# Patient Record
Sex: Female | Born: 1970
Health system: Southern US, Community
[De-identification: ages and names within clinical notes are randomized; demographics above are authoritative.]

## PROBLEM LIST (undated history)

## (undated) DIAGNOSIS — K5909 Other constipation: Secondary | ICD-10-CM

## (undated) DIAGNOSIS — M1990 Unspecified osteoarthritis, unspecified site: Secondary | ICD-10-CM

## (undated) DIAGNOSIS — N920 Excessive and frequent menstruation with regular cycle: Secondary | ICD-10-CM

## (undated) DIAGNOSIS — K589 Irritable bowel syndrome without diarrhea: Secondary | ICD-10-CM

## (undated) HISTORY — PX: HYSTEROSCOPY WITH D & C: SHX1775

## (undated) HISTORY — DX: Irritable bowel syndrome, unspecified: K58.9

---

## 1997-07-19 ENCOUNTER — Other Ambulatory Visit: Admission: RE | Admit: 1997-07-19 | Discharge: 1997-07-19 | Payer: Self-pay | Admitting: Obstetrics and Gynecology

## 1997-09-10 ENCOUNTER — Inpatient Hospital Stay (HOSPITAL_COMMUNITY): Admission: AD | Admit: 1997-09-10 | Discharge: 1997-09-10 | Payer: Self-pay | Admitting: Obstetrics & Gynecology

## 1997-12-26 ENCOUNTER — Encounter: Admission: RE | Admit: 1997-12-26 | Discharge: 1998-03-26 | Payer: Self-pay | Admitting: Obstetrics and Gynecology

## 1998-03-06 ENCOUNTER — Inpatient Hospital Stay (HOSPITAL_COMMUNITY): Admission: AD | Admit: 1998-03-06 | Discharge: 1998-03-09 | Payer: Self-pay | Admitting: Obstetrics and Gynecology

## 1998-09-13 ENCOUNTER — Other Ambulatory Visit: Admission: RE | Admit: 1998-09-13 | Discharge: 1998-09-13 | Payer: Self-pay | Admitting: Obstetrics and Gynecology

## 2000-08-01 ENCOUNTER — Emergency Department (HOSPITAL_COMMUNITY): Admission: EM | Admit: 2000-08-01 | Discharge: 2000-08-01 | Payer: Self-pay | Admitting: Emergency Medicine

## 2000-09-16 ENCOUNTER — Other Ambulatory Visit: Admission: RE | Admit: 2000-09-16 | Discharge: 2000-09-16 | Payer: Self-pay | Admitting: Obstetrics and Gynecology

## 2000-10-12 ENCOUNTER — Ambulatory Visit (HOSPITAL_COMMUNITY): Admission: RE | Admit: 2000-10-12 | Discharge: 2000-10-12 | Payer: Self-pay | Admitting: Obstetrics and Gynecology

## 2000-10-12 HISTORY — PX: DIAGNOSTIC LAPAROSCOPY: SUR761

## 2001-12-22 ENCOUNTER — Other Ambulatory Visit: Admission: RE | Admit: 2001-12-22 | Discharge: 2001-12-22 | Payer: Self-pay | Admitting: Obstetrics and Gynecology

## 2002-03-11 ENCOUNTER — Emergency Department (HOSPITAL_COMMUNITY): Admission: EM | Admit: 2002-03-11 | Discharge: 2002-03-11 | Payer: Self-pay | Admitting: Emergency Medicine

## 2002-03-11 ENCOUNTER — Encounter: Payer: Self-pay | Admitting: Emergency Medicine

## 2003-08-02 ENCOUNTER — Other Ambulatory Visit: Admission: RE | Admit: 2003-08-02 | Discharge: 2003-08-02 | Payer: Self-pay | Admitting: Obstetrics and Gynecology

## 2003-10-27 ENCOUNTER — Encounter: Admission: RE | Admit: 2003-10-27 | Discharge: 2003-10-27 | Payer: Self-pay | Admitting: Internal Medicine

## 2004-06-28 ENCOUNTER — Ambulatory Visit (HOSPITAL_COMMUNITY): Admission: RE | Admit: 2004-06-28 | Discharge: 2004-06-28 | Payer: Self-pay | Admitting: Obstetrics and Gynecology

## 2004-09-26 ENCOUNTER — Other Ambulatory Visit: Admission: RE | Admit: 2004-09-26 | Discharge: 2004-09-26 | Payer: Self-pay | Admitting: Obstetrics and Gynecology

## 2004-10-13 ENCOUNTER — Inpatient Hospital Stay (HOSPITAL_COMMUNITY): Admission: AD | Admit: 2004-10-13 | Discharge: 2004-10-13 | Payer: Self-pay | Admitting: Obstetrics and Gynecology

## 2005-03-06 ENCOUNTER — Ambulatory Visit (HOSPITAL_COMMUNITY): Admission: RE | Admit: 2005-03-06 | Discharge: 2005-03-06 | Payer: Self-pay | Admitting: Obstetrics and Gynecology

## 2005-04-03 ENCOUNTER — Inpatient Hospital Stay (HOSPITAL_COMMUNITY): Admission: AD | Admit: 2005-04-03 | Discharge: 2005-04-05 | Payer: Self-pay | Admitting: Obstetrics and Gynecology

## 2005-05-07 ENCOUNTER — Other Ambulatory Visit: Admission: RE | Admit: 2005-05-07 | Discharge: 2005-05-07 | Payer: Self-pay | Admitting: Obstetrics and Gynecology

## 2005-10-10 ENCOUNTER — Encounter: Admission: RE | Admit: 2005-10-10 | Discharge: 2005-10-10 | Payer: Self-pay | Admitting: Internal Medicine

## 2005-10-17 ENCOUNTER — Other Ambulatory Visit: Admission: RE | Admit: 2005-10-17 | Discharge: 2005-10-17 | Payer: Self-pay | Admitting: Internal Medicine

## 2006-02-21 ENCOUNTER — Inpatient Hospital Stay (HOSPITAL_COMMUNITY): Admission: AD | Admit: 2006-02-21 | Discharge: 2006-02-21 | Payer: Self-pay | Admitting: Obstetrics and Gynecology

## 2006-05-11 ENCOUNTER — Emergency Department (HOSPITAL_COMMUNITY): Admission: EM | Admit: 2006-05-11 | Discharge: 2006-05-11 | Payer: Self-pay | Admitting: *Deleted

## 2007-06-10 ENCOUNTER — Encounter: Admission: RE | Admit: 2007-06-10 | Discharge: 2007-06-10 | Payer: Self-pay | Admitting: Obstetrics and Gynecology

## 2007-12-15 ENCOUNTER — Encounter: Admission: RE | Admit: 2007-12-15 | Discharge: 2007-12-15 | Payer: Self-pay | Admitting: Obstetrics and Gynecology

## 2008-03-27 ENCOUNTER — Encounter: Admission: RE | Admit: 2008-03-27 | Discharge: 2008-03-27 | Payer: Self-pay | Admitting: Internal Medicine

## 2008-05-08 ENCOUNTER — Encounter: Admission: RE | Admit: 2008-05-08 | Discharge: 2008-05-08 | Payer: Self-pay | Admitting: Obstetrics and Gynecology

## 2009-05-07 ENCOUNTER — Encounter: Admission: RE | Admit: 2009-05-07 | Discharge: 2009-05-07 | Payer: Self-pay | Admitting: Obstetrics and Gynecology

## 2009-10-20 ENCOUNTER — Ambulatory Visit: Payer: Self-pay | Admitting: Diagnostic Radiology

## 2009-10-20 ENCOUNTER — Ambulatory Visit (HOSPITAL_BASED_OUTPATIENT_CLINIC_OR_DEPARTMENT_OTHER): Admission: RE | Admit: 2009-10-20 | Discharge: 2009-10-20 | Payer: Self-pay | Admitting: Rheumatology

## 2010-04-21 ENCOUNTER — Encounter: Payer: Self-pay | Admitting: Obstetrics and Gynecology

## 2010-05-21 ENCOUNTER — Encounter (INDEPENDENT_AMBULATORY_CARE_PROVIDER_SITE_OTHER): Payer: Self-pay | Admitting: *Deleted

## 2010-05-28 NOTE — Letter (Signed)
Summary: New Patient letter  [facility]  [address] [place], [place] [address]   Phone: [phone]  Fax: [phone]       05/21/2010 MRN: [id number]  Yvette [name] [address] [place], [place]  [address]  Dear Ms. Yvette [name],  Welcome to the Gastroenterology Division at [facility].    You are scheduled to see Dr.  Christella Hartigan on 06-28-10 at 3:15P.M. on the 3rd floor at [facility], [address]  We ask that you try to arrive at our office 15 minutes prior to your appointment time to allow for check-in.  We would like you to complete the enclosed self-administered evaluation form prior to your visit and bring it with you on the day of your appointment.  We will review it with you.  Also, please bring a complete list of all your medications or, if you prefer, bring the medication bottles and we will list them.  Please bring your insurance card so that we may make a copy of it.  If your insurance requires a referral to see a specialist, please bring your referral form from your primary care physician.  Co-payments are due at the time of your visit and may be paid by cash, check or credit card.     Your office visit will consist of a consult with your physician (includes a physical exam), any laboratory testing he/she may order, scheduling of any necessary diagnostic testing (e.g. x-ray, ultrasound, CT-scan), and scheduling of a procedure (e.g. Endoscopy, Colonoscopy) if required.  Please allow enough time on your schedule to allow for any/all of these possibilities.    If you cannot keep your appointment, please call [phone] to cancel or reschedule prior to your appointment date.  This allows [place] the opportunity to schedule an appointment for another patient in need of care.  If you do not cancel or reschedule by 5 p.m. the business day prior to your appointment date, you will be charged a $50.00 late cancellation/no-show fee.    Thank you for choosing [facility] for your medical needs.  We appreciate the opportunity to care for you.  Please visit [place] at our website  to learn more about our practice.                     Sincerely,                                                             The Gastroenterology Division

## 2010-06-28 ENCOUNTER — Other Ambulatory Visit (INDEPENDENT_AMBULATORY_CARE_PROVIDER_SITE_OTHER): Payer: PRIVATE HEALTH INSURANCE

## 2010-06-28 ENCOUNTER — Ambulatory Visit (INDEPENDENT_AMBULATORY_CARE_PROVIDER_SITE_OTHER): Payer: PRIVATE HEALTH INSURANCE | Admitting: Gastroenterology

## 2010-06-28 ENCOUNTER — Encounter: Payer: Self-pay | Admitting: Gastroenterology

## 2010-06-28 DIAGNOSIS — K5900 Constipation, unspecified: Secondary | ICD-10-CM

## 2010-06-28 LAB — TSH: TSH: 0.97 u[IU]/mL (ref 0.35–5.50)

## 2010-06-28 NOTE — Patient Instructions (Signed)
Please start taking citrucel (orange flavored) powder fiber supplement.  This may cause some bloating at first but that usually goes away. Begin with a small spoonful and work your way up to a large, heaping spoonful daily over a week. You will have labs checked today in the basement lab.  Please head down after you check out with the front desk (tsh). Call Dr. Christella Hartigan in 4-5 weeks to report on how fiber is working.  If you are not improving, then we'll have to consider colonoscopy.

## 2010-06-28 NOTE — Progress Notes (Signed)
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  Has been told she had IBS in the past.  She needs to take laxative every day.  Can go 3-4 days unless she takes laxative.  Feels bloated, nauseas until finally has a BM and then feels better.  Her symptoms are worse in the past year or so.  Overall stable weight.  Never sees blood in her stool.  No chronic pain medicines.  She had a sigmoidoscopy for constipation here in [place] many years ago, was told it was fine.  She also had an EGD at same time [facility] was found).   Review of systems: Pertinent positive and negative review of systems were noted in the above HPI section.  All other review of systems was otherwise negative.     Physical Exam: Vital signs from this visit reviewed Constitutional: generally well-appearing Psychiatric: alert and oriented x3 Eyes: extraocular movements intact Mouth: oral pharynx moist, no lesions Neck: supple no lymphadenopathy Cardiovascular: heart regular rate and rhythm Lungs: clear to auscultation bilaterally Abdomen: soft, nontender, nondistended, no obvious ascites, no peritoneal signs, normal bowel sounds Extremities: no lower extremity edema bilaterally Skin: no lesions on visible extremities    Assessment and plan: 40 year old woman with chronic constipation.  She has never tried fiber supplements and that is always my first recommendation for chronic constipation. She will begin Citrucel on a daily basis and will call here to report on her symptoms in 5-6 weeks. She had a sigmoidoscopy for this same complaint many years ago and it was normal. I do not think we necessarily need to repeat any endoscopic examinations at this point however if she does not improve with fiber supplements that I would have to consider colonoscopy to rule out structural issues. We will also check thyroid testing.

## 2010-08-16 NOTE — Op Note (Signed)
[facility]  Patient:    Yvette [name], Yvette [name]                          MRN: [id number] Proc. Date: 10/12/00 Adm. Date:  [id number] Attending:  Brynda Peon                           Operative Report  PREOPERATIVE DIAGNOSIS:  Dyspareunia, rule out endometriosis or pelvic adhesions.  POSTOPERATIVE DIAGNOSIS:  Normal pelvis.  OPERATION:  Diagnostic laparoscopy.  SURGEON:  Cynthia P. Ashley Royalty, M.D.  ANESTHESIA:  General endotracheal.  ESTIMATED BLOOD LOSS:  Minimal.  INDICATIONS:  None.  DESCRIPTION OF PROCEDURE:  The patient was taken to the operating room and, after the induction of adequate endotracheal anesthesia, was placed in the dorsolithotomy position and prepped and draped in the usual fashion.  A Hulka uterine manipulator was placed.  The bladder was drained with a red rubber catheter.  A small subumbilical incision was made, and a Verres needle was inserted into the peritoneal space.  Proper placement was tested by nothing free flow of saline through the Verres needle with a negative aspirate and then by noting the response of a drop in saline placed at the ______ Verres needle, negative pressure as the abdominal wall was elevated. Pneumoperitoneum was created with 2.5 L of CO2 using the automatic insufflator.  A disposable ______ trocar was then inserted into the peritoneal space with its proper placement noted with the laparoscope.  Next, a small suprapubic incision was made, and a 5 mm trocar was inserted under direct visualization.  The pelvis was carefully inspected.  The uterus was of normal size, shape, and contour.  The anterior and posterior cul-de-sacs were free of adhesions or endometriosis.  Both cul-de-sacs were quite vascular, however.  There were on either side of the cervix towards the posterior cul-de-sac a rather large vein that was noted.  The intra-cul-de-sac looked completely normal.  The tubes and ovaries were  normal.  The ovaries were freely mobile.  The tubes were also freely mobile.  The fimbria were luxuriant.  No abnormalities were noted.  The instruments were removed from the abdomen.  The pneumoperitoneum was allowed to escape.  The incisions were closed subcuticularly with 4-0 Vicryl ______ .  The incisions were injected for postoperative pain relief with 0.5% Marcaine with epinephrine.  The manipulator was removed from the vagina, and the procedure was terminated. The patient tolerated it well and went in satisfactory condition to post anesthesia recovery. DD:  10/12/00 TD:  10/12/00 Job: [id number]

## 2010-12-20 ENCOUNTER — Ambulatory Visit: Payer: PRIVATE HEALTH INSURANCE | Admitting: Gastroenterology

## 2011-03-26 ENCOUNTER — Ambulatory Visit: Payer: PRIVATE HEALTH INSURANCE | Admitting: Gastroenterology

## 2011-04-25 ENCOUNTER — Telehealth: Payer: Self-pay | Admitting: Gastroenterology

## 2011-04-25 NOTE — Telephone Encounter (Signed)
Message copied by Arna Snipe on Fri Apr 25, 2011  8:41 AM ------      Message from: Donata Duff      Created: Wed Mar 26, 2011  3:38 PM       Do not charge

## 2011-11-13 ENCOUNTER — Other Ambulatory Visit: Payer: Self-pay | Admitting: Obstetrics and Gynecology

## 2011-12-18 ENCOUNTER — Other Ambulatory Visit: Payer: Self-pay | Admitting: Internal Medicine

## 2011-12-18 DIAGNOSIS — R1011 Right upper quadrant pain: Secondary | ICD-10-CM

## 2011-12-25 ENCOUNTER — Other Ambulatory Visit: Payer: PRIVATE HEALTH INSURANCE

## 2011-12-30 ENCOUNTER — Ambulatory Visit
Admission: RE | Admit: 2011-12-30 | Discharge: 2011-12-30 | Disposition: A | Discharge status: Home / Self Care | Payer: BC Managed Care – PPO | Source: Ambulatory Visit | Attending: Internal Medicine | Admitting: Internal Medicine

## 2011-12-30 DIAGNOSIS — R1011 Right upper quadrant pain: Secondary | ICD-10-CM

## 2013-06-21 ENCOUNTER — Encounter: Payer: Self-pay | Admitting: Gastroenterology

## 2013-08-16 ENCOUNTER — Ambulatory Visit: Payer: BC Managed Care – PPO | Admitting: Gastroenterology

## 2013-09-16 ENCOUNTER — Telehealth: Payer: Self-pay | Admitting: Gastroenterology

## 2013-09-16 NOTE — Telephone Encounter (Signed)
Pt has a lump in her rectum appt scheduled with paula for 09/27/13

## 2013-09-27 ENCOUNTER — Ambulatory Visit (INDEPENDENT_AMBULATORY_CARE_PROVIDER_SITE_OTHER): Payer: BC Managed Care – PPO | Admitting: Nurse Practitioner

## 2013-09-27 ENCOUNTER — Encounter: Payer: Self-pay | Admitting: Nurse Practitioner

## 2013-09-27 VITALS — BP 110/78 | HR 66 | Ht 62.0 in | Wt 144.0 lb

## 2013-09-27 DIAGNOSIS — R141 Gas pain: Secondary | ICD-10-CM

## 2013-09-27 DIAGNOSIS — R140 Abdominal distension (gaseous): Secondary | ICD-10-CM

## 2013-09-27 DIAGNOSIS — K5900 Constipation, unspecified: Secondary | ICD-10-CM

## 2013-09-27 DIAGNOSIS — K649 Unspecified hemorrhoids: Secondary | ICD-10-CM

## 2013-09-27 DIAGNOSIS — R142 Eructation: Secondary | ICD-10-CM

## 2013-09-27 DIAGNOSIS — R143 Flatulence: Secondary | ICD-10-CM

## 2013-09-27 MED ORDER — HYDROCORTISONE 2.5 % RE CREA: 1.0000 "application " | TOPICAL_CREAM | Freq: Two times a day (BID) | RECTAL | Status: DC

## 2013-09-27 NOTE — Patient Instructions (Addendum)
Information on Artificial Sweeteners was given today for your review. Gas Diet was given today for your review. You have a follow up visit with Yvette ClusterPaula Guenther, NP scheduled for 10-13-2013.  We have sent the following medications to your pharmacy for you to pick up at your convenience: Anusol Cream, apply internally twice daily for seven days.

## 2013-09-27 NOTE — Progress Notes (Signed)
    HPI :   Patient is a 43 year old female evaluated by Dr. Christella HartiganJacobs in March 2012 for chronic constipation. She gives a longstanding history of IBS. She reports a normal sigmoidoscopy and EGD with a different GI group many years. Patient comes in today for evaluation of a perianal lump. Patient requires Senokot every day to keep bowels moving. Denies rectal bleeding or history of hemorrhoids. She recently noticed tender perianal lump which is still present but not as tender.   Laurelyn SickleKatina also complains of chronic postprandial nausea, belching and bloating. No abdminal pain and weight is stable. Constipation is no worse than it has ever been  Past Medical History  Diagnosis Date  . Arthritis   . IBS (irritable bowel syndrome)     Family History  Problem Relation Age of Onset  . Diabetes    . Colon cancer Neg Hx    History  Substance Use Topics  . Smoking status: Never Smoker   . Smokeless tobacco: Never Used  . Alcohol Use: No   Current Outpatient Prescriptions  Medication Sig Dispense Refill  . Calcium Carbonate-Vitamin D (CALCIUM-D PO) Take by mouth 1 dose over 46 hours.        . fish oil-omega-3 fatty acids 1000 MG capsule Take 2 g by mouth 2 (two) times daily.        Marland Kitchen. ibuprofen (ADVIL,MOTRIN) 800 MG tablet Take 800 mg by mouth every 8 (eight) hours as needed.      . Multiple Vitamin (MULTIVITAMIN) capsule Take 1 capsule by mouth daily.      Bernadette Hoit. Sennosides-Docusate Sodium (SENOKOT S PO) Take by mouth 2 (two) times daily. As needed       . hydrocortisone (ANUSOL-HC) 2.5 % rectal cream Place 1 application rectally 2 (two) times daily. Apply internally  for seven days  30 g  1   No current facility-administered medications for this visit.   No Known Allergies   Review of Systems: All systems reviewed and negative except where noted in HPI.   Physical Exam: BP 110/78  Pulse 66  Ht 5\' 2"  (1.575 m)  Wt 144 lb (65.318 kg)  BMI 26.33 kg/m2 Constitutional: Pleasant,well-developed,  black  female in no acute distress. HEENT: Normocephalic and atraumatic. Conjunctivae are normal. No scleral icterus. Neck supple.  Cardiovascular: Normal rate, regular rhythm.  Pulmonary/chest: Effort normal and breath sounds normal. No wheezing, rales or rhonchi. Abdominal: Soft, nondistended, nontender. Bowel sounds active throughout. There are no masses palpable. No hepatomegaly. Rectal: Partially thrombosed protruding hemorrhoid Extremities: no edema Lymphadenopathy: No cervical adenopathy noted. Neurological: Alert and oriented to person place and time. Skin: Skin is warm and dry. No rashes noted. Psychiatric: Normal mood and affect. Behavior is normal.   ASSESSMENT AND PLAN:  10[birth year]. 43 year old female with partially thrombosed hemorrhoid. Recommended sitz baths, steroid suppositories x 7 days, avoid constipation. Follow up with me in 2 weeks for quick recheck  2. Chronic nausea, bloating. Gas brochure given. Try stopping artificial sweeteners to see if helps with bloating.   3. Chronic constipation. Senekot works well for her. Tried Miralax in the past. Didn't recommend fiber supplement given its potential to increase bloating  4. GERD. Occasionally symptomatic with fried foods. Takes Prilosec prn

## 2013-09-28 DIAGNOSIS — R140 Abdominal distension (gaseous): Secondary | ICD-10-CM | POA: Insufficient documentation

## 2013-09-28 DIAGNOSIS — K5900 Constipation, unspecified: Secondary | ICD-10-CM | POA: Insufficient documentation

## 2013-09-28 DIAGNOSIS — K649 Unspecified hemorrhoids: Secondary | ICD-10-CM | POA: Insufficient documentation

## 2013-09-28 NOTE — Progress Notes (Signed)
I agree with the plan above 

## 2013-10-13 ENCOUNTER — Encounter: Payer: Self-pay | Admitting: Nurse Practitioner

## 2013-10-13 ENCOUNTER — Ambulatory Visit (INDEPENDENT_AMBULATORY_CARE_PROVIDER_SITE_OTHER): Payer: BC Managed Care – PPO | Admitting: Nurse Practitioner

## 2013-10-13 VITALS — BP 104/66 | HR 64 | Ht 62.5 in | Wt 146.4 lb

## 2013-10-13 DIAGNOSIS — K648 Other hemorrhoids: Secondary | ICD-10-CM

## 2013-10-13 DIAGNOSIS — K649 Unspecified hemorrhoids: Secondary | ICD-10-CM

## 2013-10-13 DIAGNOSIS — K219 Gastro-esophageal reflux disease without esophagitis: Secondary | ICD-10-CM

## 2013-10-13 DIAGNOSIS — R1319 Other dysphagia: Secondary | ICD-10-CM

## 2013-10-13 NOTE — Patient Instructions (Signed)
You can purchase Zantac or Pepcid over the counter and take as needed for heartburn.  Avoid Constipation and straining when having a bowel movement.  Information on Constipation is below for your review.  _________________________________________________________________________________  Bonita Quin have been scheduled for a Barium Esophogram with tablet at [facility] (1st floor of the hospital) on 10-17-13 at 930 am. Please arrive 15 minutes prior to your appointment for registration. Make certain not to have anything to eat or drink 6 hours prior to your test. If you need to reschedule for any reason, please contact radiology at [phone] to do so. __________________________________________________________________ A barium swallow is an examination that concentrates on views of the esophagus. This tends to be a double contrast exam (barium and two liquids which, when combined, create a gas to distend the wall of the oesophagus) or single contrast (non-ionic iodine based). The study is usually tailored to your symptoms so a good history is essential. Attention is paid during the study to the form, structure and configuration of the esophagus, looking for functional disorders (such as aspiration, dysphagia, achalasia, motility and reflux) EXAMINATION You may be asked to change into a gown, depending on the type of swallow being performed. A radiologist and radiographer will perform the procedure. The radiologist will advise you of the type of contrast selected for your procedure and direct you during the exam. You will be asked to stand, sit or lie in several different positions and to hold a small amount of fluid in your mouth before being asked to swallow while the imaging is performed .In some instances you may be asked to swallow barium coated marshmallows to assess the motility of a solid food bolus. The exam can be recorded as a digital or video fluoroscopy procedure. POST PROCEDURE It will  take 1-2 days for the barium to pass through your system. To facilitate this, it is important, unless otherwise directed, to increase your fluids for the next 24-48hrs and to resume your normal diet.  This test typically takes about 30 minutes to perform. ____________________________________________________________________________________________________________________________________________________________  Constipation Constipation is when a person has fewer than three bowel movements a week, has difficulty having a bowel movement, or has stools that are dry, hard, or larger than normal. As people grow older, constipation is more common. If you try to fix constipation with medicines that make you have a bowel movement (laxatives), the problem may get worse. Long-term laxative use may cause the muscles of the colon to become weak. A low-fiber diet, not taking in enough fluids, and taking certain medicines may make constipation worse.  CAUSES   Certain medicines, such as antidepressants, pain medicine, iron supplements, antacids, and water pills.   Certain diseases, such as diabetes, irritable bowel syndrome (IBS), thyroid disease, or depression.   Not drinking enough water.   Not eating enough fiber-rich foods.   Stress or travel.   Lack of physical activity or exercise.   Ignoring the urge to have a bowel movement.   Using laxatives too much.  SIGNS AND SYMPTOMS   Having fewer than three bowel movements a week.   Straining to have a bowel movement.   Having stools that are hard, dry, or larger than normal.   Feeling full or bloated.   Pain in the lower abdomen.   Not feeling relief after having a bowel movement.  DIAGNOSIS  Your health care provider will take a medical history and perform a physical exam. Further testing may be done for severe constipation. Some tests  may include:  A barium enema X-ray to examine your rectum, colon, and, sometimes, your small  intestine.   A sigmoidoscopy to examine your lower colon.   A colonoscopy to examine your entire colon. TREATMENT  Treatment will depend on the severity of your constipation and what is causing it. Some dietary treatments include drinking more fluids and eating more fiber-rich foods. Lifestyle treatments may include regular exercise. If these diet and lifestyle recommendations do not help, your health care provider may recommend taking over-the-counter laxative medicines to help you have bowel movements. Prescription medicines may be prescribed if over-the-counter medicines do not work.  HOME CARE INSTRUCTIONS   Eat foods that have a lot of fiber, such as fruits, vegetables, whole grains, and beans.  Limit foods high in fat and processed sugars, such as french fries, hamburgers, cookies, candies, and soda.   A fiber supplement may be added to your diet if you cannot get enough fiber from foods.   Drink enough fluids to keep your urine clear or pale yellow.   Exercise regularly or as directed by your health care provider.   Go to the restroom when you have the urge to go. Do not hold it.   Only take over-the-counter or prescription medicines as directed by your health care provider. Do not take other medicines for constipation without talking to your health care provider first.  SEEK IMMEDIATE MEDICAL CARE IF:   You have bright red blood in your stool.   Your constipation lasts for more than 4 days or gets worse.   You have abdominal or rectal pain.   You have thin, pencil-like stools.   You have unexplained weight loss. MAKE SURE YOU:   Understand these instructions.  Will watch your condition.  Will get help right away if you are not doing well or get worse. Document Released: 12/14/2003 Document Revised: 03/22/2013 Document Reviewed: 12/27/2012 [facility] Patient Information 2015 [place]. This information is not intended to replace advice given to you by  your health care provider. Make sure you discuss any questions you have with your health care provider.

## 2013-10-14 ENCOUNTER — Encounter: Payer: Self-pay | Admitting: Nurse Practitioner

## 2013-10-14 DIAGNOSIS — K219 Gastro-esophageal reflux disease without esophagitis: Secondary | ICD-10-CM | POA: Insufficient documentation

## 2013-10-14 DIAGNOSIS — R1319 Other dysphagia: Secondary | ICD-10-CM | POA: Insufficient documentation

## 2013-10-14 NOTE — Progress Notes (Signed)
     History of Present Illness:  Yvette [name] is back for recheck of thrombosed hemorrhoid. She has been using the steroid preparation with good results. Patient has been taking daily omeprazole to help with belching. She has noticed some nausea associated with taking the medication but belching improved. No significant heartburn but she does complain of intermittent solid food dysphagia present for years. She is interested in evaluating the dysphagia. No unexpected weight loss.  Current Medications, Allergies, Past Medical History, Past Surgical History, Family History and Social History were [facility].  Physical Exam: General: Pleasant, well developed , black female in no acute distress Head: Normocephalic and atraumatic Eyes:  sclerae anicteric, conjunctiva pink  Ears: Normal auditory acuity Lungs: Clear throughout to auscultation Heart: Regular rate and rhythm Abdomen: Soft, non distended, non-tender. No masses, no hepatomegaly. Normal bowel sounds Rectal: Resolution of thrombosed hemorrhoid Musculoskeletal: Symmetrical with no gross deformities  Extremities: No edema  Neurological: Alert oriented x 4, grossly nonfocal Psychological:  Alert and cooperative. Normal mood and affect  Assessment and Recommendations:  1. thrombosed hemorrhoid, resolved with steroid cream. Advised to avoid straining/constipation in the future  2. chronic constipation. Patient takes a daily Senokot with good results. We did discuss switching to MiraLax or other non-stimulant medication, she would rather stay with Senokot for now.  3. chronic, intermittent solid food dysphasia. No unexpected weight loss. Patient is interested in getting this evaluated. Will schedule her for a barium swallow with tablet. If a stricture is present then will proceed with EGD with probable dilation.  4. GERD. She has occasional heartburn with fatty food consumption. Patient was trying to  take daily omeprazole (mainly for belching). I advised taking a PPI or H2 blocker only as needed since heartburn was infrequent.

## 2013-10-17 ENCOUNTER — Ambulatory Visit (HOSPITAL_COMMUNITY): Payer: BC Managed Care – PPO

## 2013-10-18 NOTE — Progress Notes (Signed)
I agree with the plan above.  BE and posssible EGD if needed.

## 2013-10-19 ENCOUNTER — Ambulatory Visit (HOSPITAL_COMMUNITY)
Admission: RE | Admit: 2013-10-19 | Discharge: 2013-10-19 | Disposition: A | Discharge status: Home / Self Care | Payer: BC Managed Care – PPO | Source: Ambulatory Visit | Attending: Nurse Practitioner | Admitting: Nurse Practitioner

## 2013-10-19 DIAGNOSIS — R1319 Other dysphagia: Secondary | ICD-10-CM

## 2013-10-19 DIAGNOSIS — R1310 Dysphagia, unspecified: Secondary | ICD-10-CM | POA: Insufficient documentation

## 2014-12-02 ENCOUNTER — Inpatient Hospital Stay (HOSPITAL_COMMUNITY)
Admission: AD | Admit: 2014-12-02 | Discharge: 2014-12-02 | Disposition: A | Discharge status: Home / Self Care | Payer: BLUE CROSS/BLUE SHIELD | Source: Ambulatory Visit | Attending: Obstetrics and Gynecology | Admitting: Obstetrics and Gynecology

## 2014-12-02 ENCOUNTER — Encounter (HOSPITAL_COMMUNITY): Payer: Self-pay | Admitting: *Deleted

## 2014-12-02 ENCOUNTER — Inpatient Hospital Stay (HOSPITAL_COMMUNITY): Payer: BLUE CROSS/BLUE SHIELD

## 2014-12-02 DIAGNOSIS — R109 Unspecified abdominal pain: Secondary | ICD-10-CM | POA: Diagnosis present

## 2014-12-02 DIAGNOSIS — R102 Pelvic and perineal pain: Secondary | ICD-10-CM

## 2014-12-02 DIAGNOSIS — N8320 Unspecified ovarian cysts: Secondary | ICD-10-CM

## 2014-12-02 DIAGNOSIS — N83209 Unspecified ovarian cyst, unspecified side: Secondary | ICD-10-CM

## 2014-12-02 DIAGNOSIS — G8918 Other acute postprocedural pain: Secondary | ICD-10-CM | POA: Diagnosis not present

## 2014-12-02 LAB — URINALYSIS, ROUTINE W REFLEX MICROSCOPIC
Bilirubin Urine: NEGATIVE
Glucose, UA: NEGATIVE mg/dL
Ketones, ur: NEGATIVE mg/dL
Leukocytes, UA: NEGATIVE
Nitrite: NEGATIVE
Protein, ur: NEGATIVE mg/dL
Specific Gravity, Urine: >1.03 — ABNORMAL HIGH (ref 1.005–1.030)
Urobilinogen, UA: 0.2 mg/dL (ref 0.0–1.0)
pH: 6 (ref 5.0–8.0)

## 2014-12-02 LAB — CBC WITH DIFFERENTIAL/PLATELET
Basophils Absolute: 0 10*3/uL (ref 0.0–0.1)
Basophils Relative: 0 % (ref 0–1)
Eosinophils Absolute: 0.1 10*3/uL (ref 0.0–0.7)
Eosinophils Relative: 1 % (ref 0–5)
HCT: 32.3 % — ABNORMAL LOW (ref 36.0–46.0)
Hemoglobin: 10.9 g/dL — ABNORMAL LOW (ref 12.0–15.0)
Lymphocytes Relative: 31 % (ref 12–46)
Lymphs Abs: 2.4 10*3/uL (ref 0.7–4.0)
MCH: 31 pg (ref 26.0–34.0)
MCHC: 33.7 g/dL (ref 30.0–36.0)
MCV: 91.8 fL (ref 78.0–100.0)
Monocytes Absolute: 0.5 10*3/uL (ref 0.1–1.0)
Monocytes Relative: 6 % (ref 3–12)
Neutro Abs: 4.9 10*3/uL (ref 1.7–7.7)
Neutrophils Relative %: 62 % (ref 43–77)
Platelets: 282 10*3/uL (ref 150–400)
RBC: 3.52 MIL/uL — ABNORMAL LOW (ref 3.87–5.11)
RDW: 13.3 % (ref 11.5–15.5)
WBC: 7.9 10*3/uL (ref 4.0–10.5)

## 2014-12-02 LAB — URINE MICROSCOPIC-ADD ON

## 2014-12-02 MED ORDER — IBUPROFEN 800 MG PO TABS
800.0000 mg | ORAL_TABLET | Freq: Once | ORAL | Status: AC
  Administered 2014-12-02: 800 mg via ORAL
  Filled 2014-12-02: qty 1

## 2014-12-02 MED ORDER — OXYCODONE-ACETAMINOPHEN 5-325 MG PO TABS: 1.0000 | ORAL_TABLET | Freq: Four times a day (QID) | ORAL | Status: DC | PRN

## 2014-12-02 NOTE — Discharge Instructions (Signed)
Ovarian Cyst An ovarian cyst is a fluid-filled sac that forms on an ovary. The ovaries are small organs that produce eggs in women. Various types of cysts can form on the ovaries. Most are not cancerous. Many do not cause problems, and they often go away on their own. Some may cause symptoms and require treatment. Common types of ovarian cysts include:  Functional cysts--These cysts may occur every month during the menstrual cycle. This is normal. The cysts usually go away with the next menstrual cycle if the woman does not get pregnant. Usually, there are no symptoms with a functional cyst.  Endometrioma cysts--These cysts form from the tissue that lines the uterus. They are also called "chocolate cysts" because they become filled with blood that turns brown. This type of cyst can cause pain in the lower abdomen during intercourse and with your menstrual period.  Cystadenoma cysts--This type develops from the cells on the outside of the ovary. These cysts can get very big and cause lower abdomen pain and pain with intercourse. This type of cyst can twist on itself, cut off its blood supply, and cause severe pain. It can also easily rupture and cause a lot of pain.  Dermoid cysts--This type of cyst is sometimes found in both ovaries. These cysts may contain different kinds of body tissue, such as skin, teeth, hair, or cartilage. They usually do not cause symptoms unless they get very big.  Theca lutein cysts--These cysts occur when too much of a certain hormone (human chorionic gonadotropin) is produced and overstimulates the ovaries to produce an egg. This is most common after procedures used to assist with the conception of a baby (in vitro fertilization). CAUSES   Fertility drugs can cause a condition in which multiple large cysts are formed on the ovaries. This is called ovarian hyperstimulation syndrome.  A condition called polycystic ovary syndrome can cause hormonal imbalances that can lead to  nonfunctional ovarian cysts. SIGNS AND SYMPTOMS  Many ovarian cysts do not cause symptoms. If symptoms are present, they may include:  Pelvic pain or pressure.  Pain in the lower abdomen.  Pain during sexual intercourse.  Increasing girth (swelling) of the abdomen.  Abnormal menstrual periods.  Increasing pain with menstrual periods.  Stopping having menstrual periods without being pregnant. DIAGNOSIS  These cysts are commonly found during a routine or annual pelvic exam. Tests may be ordered to find out more about the cyst. These tests may include:  Ultrasound.  X-ray of the pelvis.  CT scan.  MRI.  Blood tests. TREATMENT  Many ovarian cysts go away on their own without treatment. Your health care provider may want to check your cyst regularly for 2-3 months to see if it changes. For women in menopause, it is particularly important to monitor a cyst closely because of the higher rate of ovarian cancer in menopausal women. When treatment is needed, it may include any of the following:  A procedure to drain the cyst (aspiration). This may be done using a long needle and ultrasound. It can also be done through a laparoscopic procedure. This involves using a thin, lighted tube with a tiny camera on the end (laparoscope) inserted through a small incision.  Surgery to remove the whole cyst. This may be done using laparoscopic surgery or an open surgery involving a larger incision in the lower abdomen.  Hormone treatment or birth control pills. These methods are sometimes used to help dissolve a cyst. HOME CARE INSTRUCTIONS   Only take over-the-counter   or prescription medicines as directed by your health care provider.  Follow up with your health care provider as directed.  Get regular pelvic exams and Pap tests. SEEK MEDICAL CARE IF:   Your periods are late, irregular, or painful, or they stop.  Your pelvic pain or abdominal pain does not go away.  Your abdomen becomes  larger or swollen.  You have pressure on your bladder or trouble emptying your bladder completely.  You have pain during sexual intercourse.  You have feelings of fullness, pressure, or discomfort in your stomach.  You lose weight for no apparent reason.  You feel generally ill.  You become constipated.  You lose your appetite.  You develop acne.  You have an increase in body and facial hair.  You are gaining weight, without changing your exercise and eating habits.  You think you are pregnant. SEEK IMMEDIATE MEDICAL CARE IF:   You have increasing abdominal pain.  You feel sick to your stomach (nauseous), and you throw up (vomit).  You develop a fever that comes on suddenly.  You have abdominal pain during a bowel movement.  Your menstrual periods become heavier than usual. MAKE SURE YOU:  Understand these instructions.  Will watch your condition.  Will get help right away if you are not doing well or get worse. Document Released: 03/17/2005 Document Revised: 03/22/2013 Document Reviewed: 11/22/2012 Exit[employer] Patient Information 2015 [employer]. This information is not intended to replace advice given to you by your health care provider. Make sure you discuss any questions you have with your health care provider.  

## 2014-12-02 NOTE — MAU Provider Note (Signed)
Chief Complaint: Abdominal Pain  First Provider Initiated Contact with Patient 12/02/14 1501     SUBJECTIVE HPI: Yvette [name] is a 44 y.o. G4P2 at 16 days S/p DC for polypectomy by Dr. Marcelle Overlie who presents to Maternity Admissions reporting worsening low abd since the procedure. Pain was mild in the few days following the procedure. Was seen in the office 11/30/2014 for follow-up visit. Wet prep, UA negative per patient. Pain has worsened, but was well-controlled with Motrin. Since yesterday the pain is not controlled with Motrin plus extra strength Tylenol.   LMP approximately 11/06/2014. Patient unsure how long cycles usually are, approximately 20-30 days.  Location: bilat low abd, suprapubic Quality: sharp, heavy feeling Severity: 8/10 on pain scale at worst. Minimal now since taking Motrin and Tylenol and lying down. Duration: 2 weeks, worsening over the past 2 days. Context: constant but worse with standing. Timing: constant Modifying factors: worse with standing. Improves with Motrin and extra strength Tylenol, but now they're not working as well. Associated signs and symptoms: negative for fever, chills, vaginal bleeding, vaginal discharge, urinary complaints or GI complaints. However, Patient has chronic constipation and hasn't had a bowel movement in 2 days.  Past Medical History  Diagnosis Date  . Arthritis   . IBS (irritable bowel syndrome)    OB History  Gravida Para Term Preterm AB SAB TAB Ectopic Multiple Living  4 2        2     # Outcome Date GA Lbr Len/2nd Weight Sex Delivery Anes PTL Lv  4 Gravida           3 Gravida           2 Para           1 Para              Past Surgical History  Procedure Laterality Date  . Unremarkable    . Dilation and curettage of uterus     Social History   Social History  . Marital Status: Single    Spouse Name: N/A  . Number of Children: N/A  . Years of Education: N/A   Occupational History  . Nurse    Social History  Main Topics  . Smoking status: Never Smoker   . Smokeless tobacco: Never Used  . Alcohol Use: No  . Drug Use: No  . Sexual Activity: Not on file   Other Topics Concern  . Not on file   Social History Narrative   No current facility-administered medications on file prior to encounter.   Current Outpatient Prescriptions on File Prior to Encounter  Medication Sig Dispense Refill  . fish oil-omega-3 fatty acids 1000 MG capsule Take 2 g by mouth 2 (two) times daily.      Marland Kitchen ibuprofen (ADVIL,MOTRIN) 800 MG tablet Take 800 mg by mouth every 8 (eight) hours as needed.    . Multiple Vitamin (MULTIVITAMIN) capsule Take 1 capsule by mouth daily.    Bernadette Hoit Sodium (SENOKOT S PO) Take 1 tablet by mouth 2 (two) times daily. As needed     No Known Allergies  I have reviewed the past Medical Hx, Surgical Hx, Social Hx, Allergies and Medications.   Review of Systems  Constitutional: Negative for fever, chills and appetite change.  Gastrointestinal: Positive for abdominal pain and constipation. Negative for nausea, vomiting, diarrhea, abdominal distention and rectal pain.  Genitourinary: Negative for dysuria, urgency, frequency, flank pain, vaginal bleeding, vaginal discharge, vaginal pain and menstrual problem.  Musculoskeletal: Negative for back pain.    OBJECTIVE Patient Vitals for the past 24 hrs:  BP Temp Temp src Pulse Resp Height  12/02/14 1413 127/74 mmHg 98.3 F (36.8 C) Oral 63 18 -  12/02/14 1412 - - - - -  (1.575 m)   Constitutional: Well-developed, well-nourished female in no acute distress.  Cardiovascular: normal rate Respiratory: normal rate and effort.  GI: Abd soft, mild-moderate tenderness across lower abdomen, greatest in suprapubic area. Pos BS x 4 MS: Extremities nontender, no edema, normal ROM Neurologic: Alert and oriented x 4.  GU: Neg CVAT.  PELVIC EXAM: NEFG, physiologic discharge, no blood noted, cervix clean  BIMANUAL: cervix closed, uterus  normal size, retroverted. no adnexal tenderness or masses. No CMT.  LAB RESULTS Results for orders placed or performed during the hospital encounter of 12/02/14 (from the past 24 hour(s))  Urinalysis, Routine w reflex microscopic (not at [facility])     Status: Abnormal   Collection Time: 12/02/14  2:00 PM  Result Value Ref Range   Color, Urine YELLOW YELLOW   APPearance CLEAR CLEAR   Specific Gravity, Urine >1.030 (H) 1.005 - 1.030   pH 6.0 5.0 - 8.0   Glucose, UA NEGATIVE NEGATIVE mg/dL   Hgb urine dipstick TRACE (A) NEGATIVE   Bilirubin Urine NEGATIVE NEGATIVE   Ketones, ur NEGATIVE NEGATIVE mg/dL   Protein, ur NEGATIVE NEGATIVE mg/dL   Urobilinogen, UA 0.2 0.0 - 1.0 mg/dL   Nitrite NEGATIVE NEGATIVE   Leukocytes, UA NEGATIVE NEGATIVE  Urine microscopic-add on     Status: Abnormal   Collection Time: 12/02/14  2:00 PM  Result Value Ref Range   Squamous Epithelial / LPF FEW (A) RARE   WBC, UA 0-2 <3 WBC/hpf   RBC / HPF 3-6 <3 RBC/hpf   Bacteria, UA MANY (A) RARE  CBC with Differential/Platelet     Status: Abnormal   Collection Time: 12/02/14  2:23 PM  Result Value Ref Range   WBC 7.9 4.0 - 10.5 K/uL   RBC 3.52 (L) 3.87 - 5.11 MIL/uL   Hemoglobin 10.9 (L) 12.0 - 15.0 g/dL   HCT 11.9 (L) 14.7 - 82.9 %   MCV 91.8 78.0 - 100.0 fL   MCH 31.0 26.0 - 34.0 pg   MCHC 33.7 30.0 - 36.0 g/dL   RDW 56.2 13.0 - 86.5 %   Platelets 282 150 - 400 K/uL   Neutrophils Relative % 62 43 - 77 %   Neutro Abs 4.9 1.7 - 7.7 K/uL   Lymphocytes Relative 31 12 - 46 %   Lymphs Abs 2.4 0.7 - 4.0 K/uL   Monocytes Relative 6 3 - 12 %   Monocytes Absolute 0.5 0.1 - 1.0 K/uL   Eosinophils Relative 1 0 - 5 %   Eosinophils Absolute 0.1 0.0 - 0.7 K/uL   Basophils Relative 0 0 - 1 %   Basophils Absolute 0.0 0.0 - 0.1 K/uL    IMAGING US Transvaginal Non-ob  12/02/2014   CLINICAL DATA:  Postoperative pelvic pain. Status post D and C for a polyp on 11/16/2014. Last menstrual period 11/06/2014.  EXAM:  TRANSABDOMINAL AND TRANSVAGINAL ULTRASOUND OF PELVIS  TECHNIQUE: Both transabdominal and transvaginal ultrasound examinations of the pelvis were performed. Transabdominal technique was performed for global imaging of the pelvis including uterus, ovaries, adnexal regions, and pelvic cul-de-sac. It was necessary to proceed with endovaginal exam following the transabdominal exam to visualize the endometrium in better detail.  COMPARISON:  Ob ultrasound dated 03/06/2005  and hysterosalpingogram dated 06/28/2004.  FINDINGS: Uterus  Measurements: 9.0 x 7.2 x 5.6 cm. No fibroids or other mass visualized.  Endometrium  Thickness: 5.8 mm. Mobile echogenic material within the endometrium in the fundus, without internal blood flow with color Doppler. This appears fluid at real time.  Right ovary  Measurements: 4.1 x 3.2 x 1.9 cm. 1.5 cm oval, circumscribed, mildly hypoechoic area within the ovary. No internal blood flow with color Doppler.  Left ovary  Measurements: 4.3 x 2.8 x 2.6 cm. Normal appearance/no adnexal mass.  Other findings  No free fluid.  IMPRESSION: 1. Small amount of hemorrhage within the endometrial canal in the uterine fundus. 2. 1.5 cm probable hemorrhagic cyst in the right ovary.   Electronically Signed   By: Beckie Salts M.D.   On: 12/02/2014 17:04   US Pelvis Complete  12/02/2014   CLINICAL DATA:  Postoperative pelvic pain. Status post D and C for a polyp on 11/16/2014. Last menstrual period 11/06/2014.  EXAM: TRANSABDOMINAL AND TRANSVAGINAL ULTRASOUND OF PELVIS  TECHNIQUE: Both transabdominal and transvaginal ultrasound examinations of the pelvis were performed. Transabdominal technique was performed for global imaging of the pelvis including uterus, ovaries, adnexal regions, and pelvic cul-de-sac. It was necessary to proceed with endovaginal exam following the transabdominal exam to visualize the endometrium in better detail.  COMPARISON:  Ob ultrasound dated 03/06/2005 and hysterosalpingogram dated  06/28/2004.  FINDINGS: Uterus  Measurements: 9.0 x 7.2 x 5.6 cm. No fibroids or other mass visualized.  Endometrium  Thickness: 5.8 mm. Mobile echogenic material within the endometrium in the fundus, without internal blood flow with color Doppler. This appears fluid at real time.  Right ovary  Measurements: 4.1 x 3.2 x 1.9 cm. 1.5 cm oval, circumscribed, mildly hypoechoic area within the ovary. No internal blood flow with color Doppler.  Left ovary  Measurements: 4.3 x 2.8 x 2.6 cm. Normal appearance/no adnexal mass.  Other findings  No free fluid.  IMPRESSION: 1. Small amount of hemorrhage within the endometrial canal in the uterine fundus. 2. 1.5 cm probable hemorrhagic cyst in the right ovary.   Electronically Signed   By: Beckie Salts M.D.   On: 12/02/2014 17:04    MAU COURSE CBC, UA, pelvic ultrasound.  Discussed symptoms, exam, ultrasound and lab work with Dr. Arelia Sneddon. Pain is likely from hemorrhagic ovarian cyst. Okay to prescribe stronger pain medication and have patient follow-up with Dr. Marcelle Overlie when office opens on 12/05/2014.  MDM 44 year old female 60 days status post D&C and polypectomy presents with low abdominal pain that appears to be caused by right hemorrhagic ovarian cyst. Trace blood in urine, but no other evidence of UTI. We'll send urine for culture to be sure. No evidence of emergent condition or procedure,-related complication.  ASSESSMENT 1. Hemorrhagic ovarian cyst   2. Postoperative pain   3. Pelvic pain in female     PLAN Discharge home in stable condition Per consult with Dr. Arelia Sneddon. Work note given. Pelvic rest until pain resolves. Urine sent for culture. Return to maternity admissions for fever greater than 100.4 or pain not controlled by Percocet.      Follow-up Information    Follow up with Meriel Pica, MD. Call on 12/05/2014.   Specialty:  Obstetrics and Gynecology   Why:  to schedule an appointment   Contact information:   [address]  [address] [place] [address] [phone]       Follow up with [facility] [place] [facility].  Why:  As needed in emergencies   Contact information:   [address] [id number] mc [place] [address] [phone]       Medication List    TAKE these medications        fish oil-omega-3 fatty acids 1000 MG capsule  Take 2 g by mouth 2 (two) times daily.     ibuprofen 800 MG tablet  Commonly known as:  ADVIL,MOTRIN  Take 800 mg by mouth every 8 (eight) hours as needed.     multivitamin capsule  Take 1 capsule by mouth daily.     oxyCODONE-acetaminophen 5-325 MG per tablet  Commonly known as:  PERCOCET/ROXICET  Take 1-2 tablets by mouth every 6 (six) hours as needed.     SENOKOT S PO  Take 1 tablet by mouth 2 (two) times daily. As needed         [place], CNM 12/02/2014  5:59 PM

## 2014-12-04 LAB — URINE CULTURE: Special Requests: NORMAL

## 2015-08-30 NOTE — H&P (Signed)
Burman RiisKatina E Maudlin  DICTATION # [id number] CSN# [id number]   Meriel PicaHOLLAND,Terita Hejl M, MD 08/30/2015 11:05 AM

## 2015-08-31 NOTE — H&P (Signed)
NAMSara [name]:  Swantek, Bret                 ACCOUNT NO.:  [id number]  MEDICAL RECORD NO.:  [id number]  LOCATION:                                 FACILITY:  PHYSICIAN:  Duke Salviaichard M. Marcelle OverlieHolland, M.D.DATE OF BIRTH:  June 19, 1970  DATE OF ADMISSION:  09/17/2015 DATE OF DISCHARGE:                             HISTORY & PHYSICAL   CHIEF COMPLAINT:  Menorrhagia and dysmenorrhea.  HISTORY OF PRESENT ILLNESS:  A 45 year old G3, P2, her husband has had a vasectomy, the patient presents for definitive LAVH bilateral salpingectomy to correct menorrhagia and dysmenorrhea, felt secondary to possible adenomyosis.  In June of last year, she underwent D and C, hysteroscopy, with normal pathology, but has continued to have problematic cramps and heavy bleeding.  Her last ultrasound on April 16, showed thickened myometrium suspicious for adenomyosis.  The procedure of LAVH bilateral salpingectomy including specific risks related to bleeding, infection, transfusion, adjacent organ injury, wound infection, phlebitis, the possible need to complete surgery by open technique, and the rationale for salpingectomy all discussed with her, which she understands and accepts.  ALLERGIES:  Doxycycline.  CURRENT MEDICATIONS: 1. Ibuprofen. 2. Klonopin. 3. Other OTC supplements.  PAST MEDICAL HISTORY:  She has had 2 vaginal deliveries in 2007 and 1999.  FAMILY HISTORY:  Unremarkable.  SOCIAL HISTORY:  Denies alcohol, tobacco, or drug use.  She is single.  PHYSICAL EXAM:  VITAL SIGNS:  Temp 98.2, blood pressure 120/78. HEENT:  Unremarkable. NECK:  Supple without masses. LUNGS:  Clear. CARDIOVASCULAR:  Regular rate and rhythm.  No murmurs, rubs, or gallops. BREASTS:  Without masses. ABDOMEN:  Soft, flat, nontender. GU:  Vulva, vagina, and cervix normal.  Uterus mid position, upper limit of normal size, mobile.  Adnexa negative. EXTREMITIES:  Unremarkable. NEUROLOGIC:  Unremarkable.  Last Pap on May 16, was  normal.  IMPRESSION:  Menorrhagia with dysmenorrhea, probable adenomyosis.  PLAN:  LAVH bilateral salpingectomy.  Procedure and risks discussed as above.     Maddelynn Moosman M. Marcelle OverlieHolland, M.D.   ______________________________ Duke Salviaichard M. Marcelle OverlieHolland, M.D.    RM[facility]/MEDQ  D:  08/30/2015  T:  08/31/2015  Job:  [id number]

## 2015-09-12 ENCOUNTER — Encounter (HOSPITAL_BASED_OUTPATIENT_CLINIC_OR_DEPARTMENT_OTHER): Payer: Self-pay | Admitting: *Deleted

## 2015-09-12 NOTE — Progress Notes (Signed)
NPO AFTER MN.  ARRIVE AT [id number].  NEEDS CBC,  T & S,  AND URINE PREG.  WILL TAKE KLONOPIN AM DOS W/ SIPS OF WATER.  PT AWARE OWER AT MAIN.

## 2015-09-14 NOTE — Anesthesia Preprocedure Evaluation (Addendum)
Anesthesia Evaluation  Patient identified by MRN, date of birth, ID band Patient awake    Reviewed: Allergy & Precautions, NPO status , Patient's Chart, lab work & pertinent test results  Airway Mallampati: II   Neck ROM: Full    Dental  (+) Dental Advisory Given   Pulmonary neg pulmonary ROS,    breath sounds clear to auscultation       Cardiovascular negative cardio ROS   Rhythm:Regular     Neuro/Psych negative neurological ROS  negative psych ROS   GI/Hepatic negative GI ROS, Neg liver ROS, GERD  Medicated,  Endo/Other  negative endocrine ROS  Renal/GU negative Renal ROS  negative genitourinary   Musculoskeletal negative musculoskeletal ROS (+)   Abdominal (+)  Abdomen: soft.    Peds negative pediatric ROS (+)  Hematology negative hematology ROS (+) 11/35   Anesthesia Other Findings PG test neg 09/17/2015  Reproductive/Obstetrics negative OB ROS                           Anesthesia Physical Anesthesia Plan  ASA: I  Anesthesia Plan: General   Post-op Pain Management:    Induction: Intravenous  Airway Management Planned: Oral ETT  Additional Equipment:   Intra-op Plan:   Post-operative Plan: Extubation in OR  Informed Consent: I have reviewed the patients History and Physical, chart, labs and discussed the procedure including the risks, benefits and alternatives for the proposed anesthesia with the patient or authorized representative who has indicated his/her understanding and acceptance.     Plan Discussed with:   Anesthesia Plan Comments: (Check AM CBC, PG test, T&S)        Anesthesia Quick Evaluation

## 2015-09-17 ENCOUNTER — Encounter (HOSPITAL_BASED_OUTPATIENT_CLINIC_OR_DEPARTMENT_OTHER): Payer: Self-pay | Admitting: *Deleted

## 2015-09-17 ENCOUNTER — Observation Stay (HOSPITAL_BASED_OUTPATIENT_CLINIC_OR_DEPARTMENT_OTHER)
Admission: RE | Admit: 2015-09-17 | Discharge: 2015-09-18 | Disposition: A | Discharge status: Home / Self Care | Payer: BLUE CROSS/BLUE SHIELD | Source: Ambulatory Visit | Attending: Obstetrics and Gynecology | Admitting: Obstetrics and Gynecology

## 2015-09-17 ENCOUNTER — Ambulatory Visit (HOSPITAL_BASED_OUTPATIENT_CLINIC_OR_DEPARTMENT_OTHER): Payer: BLUE CROSS/BLUE SHIELD | Admitting: Anesthesiology

## 2015-09-17 ENCOUNTER — Encounter (HOSPITAL_COMMUNITY)
Admission: RE | Disposition: A | Discharge status: Home / Self Care | Payer: Self-pay | Source: Ambulatory Visit | Attending: Obstetrics and Gynecology

## 2015-09-17 DIAGNOSIS — N946 Dysmenorrhea, unspecified: Secondary | ICD-10-CM | POA: Diagnosis not present

## 2015-09-17 DIAGNOSIS — K219 Gastro-esophageal reflux disease without esophagitis: Secondary | ICD-10-CM | POA: Insufficient documentation

## 2015-09-17 DIAGNOSIS — N920 Excessive and frequent menstruation with regular cycle: Secondary | ICD-10-CM | POA: Diagnosis not present

## 2015-09-17 HISTORY — PX: LAPAROSCOPIC ASSISTED VAGINAL HYSTERECTOMY: SHX5398

## 2015-09-17 HISTORY — DX: Unspecified osteoarthritis, unspecified site: M19.90

## 2015-09-17 HISTORY — PX: BILATERAL SALPINGECTOMY: SHX5743

## 2015-09-17 HISTORY — DX: Excessive and frequent menstruation with regular cycle: N92.0

## 2015-09-17 HISTORY — DX: Other constipation: K59.09

## 2015-09-17 LAB — CBC
HCT: 35.5 % — ABNORMAL LOW (ref 36.0–46.0)
Hemoglobin: 11.9 g/dL — ABNORMAL LOW (ref 12.0–15.0)
MCH: 30.4 pg (ref 26.0–34.0)
MCHC: 33.5 g/dL (ref 30.0–36.0)
MCV: 90.8 fL (ref 78.0–100.0)
Platelets: 269 10*3/uL (ref 150–400)
RBC: 3.91 MIL/uL (ref 3.87–5.11)
RDW: 12.8 % (ref 11.5–15.5)
WBC: 5.7 10*3/uL (ref 4.0–10.5)

## 2015-09-17 LAB — POCT PREGNANCY, URINE: Preg Test, Ur: NEGATIVE

## 2015-09-17 LAB — ABO/RH: ABO/RH(D): A POS

## 2015-09-17 LAB — TYPE AND SCREEN
ABO/RH(D): A POS
Antibody Screen: NEGATIVE

## 2015-09-17 SURGERY — HYSTERECTOMY, VAGINAL, LAPAROSCOPY-ASSISTED
Anesthesia: General

## 2015-09-17 MED ORDER — ONDANSETRON HCL 4 MG/2ML IJ SOLN
INTRAMUSCULAR | Status: AC
  Filled 2015-09-17: qty 2

## 2015-09-17 MED ORDER — KETOROLAC TROMETHAMINE 30 MG/ML IJ SOLN
30.0000 mg | Freq: Four times a day (QID) | INTRAMUSCULAR | Status: DC
  Administered 2015-09-17 – 2015-09-18 (×2): 30 mg via INTRAVENOUS
  Filled 2015-09-17 (×6): qty 1

## 2015-09-17 MED ORDER — FENTANYL CITRATE (PF) 100 MCG/2ML IJ SOLN
INTRAMUSCULAR | Status: AC
  Filled 2015-09-17: qty 2

## 2015-09-17 MED ORDER — DEXTROSE IN LACTATED RINGERS 5 % IV SOLN
INTRAVENOUS | Status: DC
  Administered 2015-09-17 – 2015-09-18 (×3): via INTRAVENOUS

## 2015-09-17 MED ORDER — ONDANSETRON HCL 4 MG/2ML IJ SOLN: 4.0000 mg | Freq: Four times a day (QID) | INTRAMUSCULAR | Status: DC | PRN

## 2015-09-17 MED ORDER — DEXTROSE 5 % IV SOLN
INTRAVENOUS | Status: AC
  Filled 2015-09-17: qty 50

## 2015-09-17 MED ORDER — PROPOFOL 10 MG/ML IV BOLUS
INTRAVENOUS | Status: DC | PRN
  Administered 2015-09-17: 200 mg via INTRAVENOUS
  Administered 2015-09-17: 50 mg via INTRAVENOUS

## 2015-09-17 MED ORDER — NEOSTIGMINE METHYLSULFATE 10 MG/10ML IV SOLN
INTRAVENOUS | Status: AC
  Filled 2015-09-17: qty 1

## 2015-09-17 MED ORDER — GLYCOPYRROLATE 0.2 MG/ML IJ SOLN
INTRAMUSCULAR | Status: AC
  Filled 2015-09-17: qty 1

## 2015-09-17 MED ORDER — BUTORPHANOL TARTRATE 1 MG/ML IJ SOLN: 1.0000 mg | INTRAMUSCULAR | Status: DC | PRN

## 2015-09-17 MED ORDER — NALOXONE HCL 0.4 MG/ML IJ SOLN: 0.4000 mg | INTRAMUSCULAR | Status: DC | PRN

## 2015-09-17 MED ORDER — LACTATED RINGERS IV SOLN
INTRAVENOUS | Status: DC
  Administered 2015-09-17 (×2): via INTRAVENOUS
  Filled 2015-09-17: qty 1000

## 2015-09-17 MED ORDER — ACETAMINOPHEN 10 MG/ML IV SOLN
INTRAVENOUS | Status: DC | PRN
  Administered 2015-09-17: 1000 mg via INTRAVENOUS

## 2015-09-17 MED ORDER — MORPHINE SULFATE 2 MG/ML IV SOLN
INTRAVENOUS | Status: DC
  Administered 2015-09-17: 2 mg via INTRAVENOUS
  Administered 2015-09-17: 11:00:00 via INTRAVENOUS
  Administered 2015-09-17: 5 mg via INTRAVENOUS
  Administered 2015-09-18: 1 mg via INTRAVENOUS
  Administered 2015-09-18: 2 mg via INTRAVENOUS
  Filled 2015-09-17: qty 25

## 2015-09-17 MED ORDER — ROCURONIUM BROMIDE 100 MG/10ML IV SOLN
INTRAVENOUS | Status: DC | PRN
  Administered 2015-09-17: 40 mg via INTRAVENOUS

## 2015-09-17 MED ORDER — LACTATED RINGERS IR SOLN
Status: DC | PRN
  Administered 2015-09-17: 1

## 2015-09-17 MED ORDER — MIDAZOLAM HCL 5 MG/5ML IJ SOLN
INTRAMUSCULAR | Status: DC | PRN
  Administered 2015-09-17: 2 mg via INTRAVENOUS

## 2015-09-17 MED ORDER — IBUPROFEN 800 MG PO TABS
800.0000 mg | ORAL_TABLET | Freq: Three times a day (TID) | ORAL | Status: DC | PRN
  Administered 2015-09-18: 800 mg via ORAL
  Filled 2015-09-17: qty 1

## 2015-09-17 MED ORDER — SUCCINYLCHOLINE CHLORIDE 20 MG/ML IJ SOLN
INTRAMUSCULAR | Status: DC | PRN
  Administered 2015-09-17: 100 mg via INTRAVENOUS

## 2015-09-17 MED ORDER — SODIUM CHLORIDE 0.9% FLUSH: 9.0000 mL | INTRAVENOUS | Status: DC | PRN

## 2015-09-17 MED ORDER — MIDAZOLAM HCL 2 MG/2ML IJ SOLN
INTRAMUSCULAR | Status: AC
  Filled 2015-09-17: qty 2

## 2015-09-17 MED ORDER — ONDANSETRON HCL 4 MG/2ML IJ SOLN
INTRAMUSCULAR | Status: DC | PRN
  Administered 2015-09-17: 4 mg via INTRAVENOUS

## 2015-09-17 MED ORDER — DEXAMETHASONE SODIUM PHOSPHATE 4 MG/ML IJ SOLN
INTRAMUSCULAR | Status: DC | PRN
  Administered 2015-09-17: 10 mg via INTRAVENOUS

## 2015-09-17 MED ORDER — PROPOFOL 10 MG/ML IV BOLUS
INTRAVENOUS | Status: AC
  Filled 2015-09-17: qty 20

## 2015-09-17 MED ORDER — FENTANYL CITRATE (PF) 100 MCG/2ML IJ SOLN
INTRAMUSCULAR | Status: DC | PRN
  Administered 2015-09-17 (×2): 25 ug via INTRAVENOUS
  Administered 2015-09-17 (×3): 50 ug via INTRAVENOUS

## 2015-09-17 MED ORDER — KETOROLAC TROMETHAMINE 30 MG/ML IJ SOLN
30.0000 mg | Freq: Four times a day (QID) | INTRAMUSCULAR | Status: DC
  Filled 2015-09-17 (×5): qty 1

## 2015-09-17 MED ORDER — CEFTRIAXONE SODIUM 2 G IJ SOLR
INTRAMUSCULAR | Status: AC
  Filled 2015-09-17: qty 2

## 2015-09-17 MED ORDER — KETOROLAC TROMETHAMINE 30 MG/ML IJ SOLN
INTRAMUSCULAR | Status: DC | PRN
  Administered 2015-09-17: 30 mg via INTRAVENOUS

## 2015-09-17 MED ORDER — ONDANSETRON HCL 4 MG PO TABS: 4.0000 mg | ORAL_TABLET | Freq: Four times a day (QID) | ORAL | Status: DC | PRN

## 2015-09-17 MED ORDER — OXYCODONE-ACETAMINOPHEN 5-325 MG PO TABS: 1.0000 | ORAL_TABLET | ORAL | Status: DC | PRN

## 2015-09-17 MED ORDER — MENTHOL 3 MG MT LOZG
1.0000 | LOZENGE | OROMUCOSAL | Status: DC | PRN
  Filled 2015-09-17: qty 9

## 2015-09-17 MED ORDER — DIPHENHYDRAMINE HCL 12.5 MG/5ML PO ELIX: 12.5000 mg | ORAL_SOLUTION | Freq: Four times a day (QID) | ORAL | Status: DC | PRN

## 2015-09-17 MED ORDER — KETOROLAC TROMETHAMINE 30 MG/ML IJ SOLN
30.0000 mg | Freq: Once | INTRAMUSCULAR | Status: AC
Start: 2015-09-17 — End: 2015-09-17
  Administered 2015-09-17: 30 mg via INTRAVENOUS
  Filled 2015-09-17: qty 1

## 2015-09-17 MED ORDER — LIDOCAINE HCL (CARDIAC) 20 MG/ML IV SOLN
INTRAVENOUS | Status: AC
  Filled 2015-09-17: qty 5

## 2015-09-17 MED ORDER — FENTANYL CITRATE (PF) 100 MCG/2ML IJ SOLN
25.0000 ug | INTRAMUSCULAR | Status: DC | PRN
  Administered 2015-09-17: 50 ug via INTRAVENOUS
  Filled 2015-09-17: qty 1

## 2015-09-17 MED ORDER — BUPIVACAINE HCL 0.25 % IJ SOLN
INTRAMUSCULAR | Status: DC | PRN
  Administered 2015-09-17: 6 mL

## 2015-09-17 MED ORDER — NEOSTIGMINE METHYLSULFATE 10 MG/10ML IV SOLN
INTRAVENOUS | Status: DC | PRN
  Administered 2015-09-17: 4 mg via INTRAVENOUS

## 2015-09-17 MED ORDER — EPHEDRINE SULFATE 50 MG/ML IJ SOLN
INTRAMUSCULAR | Status: DC | PRN
  Administered 2015-09-17 (×2): 10 mg via INTRAVENOUS

## 2015-09-17 MED ORDER — PROMETHAZINE HCL 25 MG/ML IJ SOLN
6.2500 mg | INTRAMUSCULAR | Status: DC | PRN
  Filled 2015-09-17: qty 1

## 2015-09-17 MED ORDER — DEXAMETHASONE SODIUM PHOSPHATE 10 MG/ML IJ SOLN
INTRAMUSCULAR | Status: AC
  Filled 2015-09-17: qty 1

## 2015-09-17 MED ORDER — MEPERIDINE HCL 25 MG/ML IJ SOLN
6.2500 mg | INTRAMUSCULAR | Status: DC | PRN
  Filled 2015-09-17: qty 1

## 2015-09-17 MED ORDER — DEXTROSE 5 % IV SOLN
2.0000 g | INTRAVENOUS | Status: AC
  Administered 2015-09-17: 2 g via INTRAVENOUS
  Filled 2015-09-17: qty 2

## 2015-09-17 MED ORDER — LIDOCAINE HCL (CARDIAC) 20 MG/ML IV SOLN
INTRAVENOUS | Status: DC | PRN
  Administered 2015-09-17: 100 mg via INTRAVENOUS

## 2015-09-17 MED ORDER — DIPHENHYDRAMINE HCL 50 MG/ML IJ SOLN: 12.5000 mg | Freq: Four times a day (QID) | INTRAMUSCULAR | Status: DC | PRN

## 2015-09-17 MED ORDER — GLYCOPYRROLATE 0.2 MG/ML IJ SOLN
INTRAMUSCULAR | Status: DC | PRN
  Administered 2015-09-17: 0.6 mg via INTRAVENOUS

## 2015-09-17 SURGICAL SUPPLY — 67 items
BLADE SURG 10 STRL SS (BLADE) IMPLANT
BLADE SURG 11 STRL SS (BLADE) ×3 IMPLANT
CATH ROBINSON RED A/P 16FR (CATHETERS) ×3 IMPLANT
CLEANER CAUTERY TIP 5X5 PAD (MISCELLANEOUS) ×2 IMPLANT
COVER BACK TABLE 60X90IN (DRAPES) ×6 IMPLANT
COVER MAYO STAND STRL (DRAPES) ×6 IMPLANT
DRAPE LG THREE QUARTER DISP (DRAPES) ×3 IMPLANT
DRAPE UNDERBUTTOCKS STRL (DRAPE) ×3 IMPLANT
DRSG OPSITE POSTOP 3X4 (GAUZE/BANDAGES/DRESSINGS) IMPLANT
DRSG TEGADERM 2-3/8X2-3/4 SM (GAUZE/BANDAGES/DRESSINGS) ×3 IMPLANT
ELECT LIGASURE LONG (ELECTRODE) IMPLANT
ELECT LIGASURE SHORT 9 REUSE (ELECTRODE) ×3 IMPLANT
ELECT REM PT RETURN 9FT ADLT (ELECTROSURGICAL) ×3
ELECTRODE REM PT RTRN 9FT ADLT (ELECTROSURGICAL) ×2 IMPLANT
GLOVE BIO SURGEON STRL SZ 6.5 (GLOVE) ×3 IMPLANT
GLOVE BIO SURGEON STRL SZ7 (GLOVE) ×9 IMPLANT
GOWN STRL REUS W/ TWL LRG LVL3 (GOWN DISPOSABLE) ×8 IMPLANT
GOWN STRL REUS W/TWL LRG LVL3 (GOWN DISPOSABLE) ×12
HOLDER FOLEY CATH W/STRAP (MISCELLANEOUS) ×3 IMPLANT
KIT ROOM TURNOVER WOR (KITS) ×3 IMPLANT
LIQUID BAND (GAUZE/BANDAGES/DRESSINGS) ×3 IMPLANT
MANIFOLD NEPTUNE II (INSTRUMENTS) IMPLANT
NDL HYPO 25X1 1.5 SAFETY (NEEDLE) ×2 IMPLANT
NDL INSUFFLATION 14GA 120MM (NEEDLE) ×2 IMPLANT
NDL SPNL 22GX3.5 QUINCKE BK (NEEDLE) IMPLANT
NEEDLE HYPO 25X1 1.5 SAFETY (NEEDLE) ×3 IMPLANT
NEEDLE INSUFFLATION 14GA 120MM (NEEDLE) ×3 IMPLANT
NEEDLE SPNL 22GX3.5 QUINCKE BK (NEEDLE) IMPLANT
NS IRRIG 500ML POUR BTL (IV SOLUTION) ×3 IMPLANT
PACK BASIN DAY SURGERY FS (CUSTOM PROCEDURE TRAY) ×3 IMPLANT
PACKING VAGINAL (PACKING) IMPLANT
PAD CLEANER CAUTERY TIP 5X5 (MISCELLANEOUS) ×1
PAD OB MATERNITY 4.3X12.25 (PERSONAL CARE ITEMS) ×3 IMPLANT
PADDING ION DISPOSABLE (MISCELLANEOUS) ×3 IMPLANT
PENCIL BUTTON HOLSTER BLD 10FT (ELECTRODE) ×3 IMPLANT
SEALER TISSUE G2 CVD JAW 45CM (ENDOMECHANICALS) ×3 IMPLANT
SET IRRIG TUBING LAPAROSCOPIC (IRRIGATION / IRRIGATOR) IMPLANT
SHEET LAVH (DRAPES) ×3 IMPLANT
SOLUTION ANTI FOG 6CC (MISCELLANEOUS) ×3 IMPLANT
SOLUTION ELECTROLUBE (MISCELLANEOUS) IMPLANT
SPONGE GAUZE 2X2 8PLY STRL LF (GAUZE/BANDAGES/DRESSINGS) ×3 IMPLANT
SPONGE GAUZE 4X4 12PLY STER LF (GAUZE/BANDAGES/DRESSINGS) IMPLANT
SPONGE LAP 4X18 X RAY DECT (DISPOSABLE) ×3 IMPLANT
STRIP CLOSURE SKIN 1/4X3 (GAUZE/BANDAGES/DRESSINGS) IMPLANT
SUT MNCRL AB 4-0 PS2 18 (SUTURE) ×3 IMPLANT
SUT MON AB 2-0 CT1 36 (SUTURE) IMPLANT
SUT VIC AB 0 CT1 18XCR BRD8 (SUTURE) ×2 IMPLANT
SUT VIC AB 0 CT1 36 (SUTURE) ×3 IMPLANT
SUT VIC AB 0 CT1 8-18 (SUTURE) ×3
SUT VIC AB 2-0 CT1 (SUTURE) ×4 IMPLANT
SUT VIC AB 2-0 UR6 27 (SUTURE) IMPLANT
SUT VICRYL 0 TIES 12 18 (SUTURE) ×3 IMPLANT
SUT VICRYL 0 UR6 27IN ABS (SUTURE) ×1 IMPLANT
SUT VICRYL 4-0 PS2 18IN ABS (SUTURE) IMPLANT
SYR BULB IRRIGATION 50ML (SYRINGE) ×3 IMPLANT
SYR CONTROL 10ML LL (SYRINGE) ×3 IMPLANT
SYRINGE 10CC LL (SYRINGE) ×6 IMPLANT
TOWEL OR 17X24 6PK STRL BLUE (TOWEL DISPOSABLE) ×6 IMPLANT
TRAY DSU PREP LF (CUSTOM PROCEDURE TRAY) ×3 IMPLANT
TRAY FOLEY CATH SILVER 14FR (SET/KITS/TRAYS/PACK) ×3 IMPLANT
TROCAR OPTI TIP 5M 100M (ENDOMECHANICALS) ×3 IMPLANT
TROCAR XCEL DIL TIP R 11M (ENDOMECHANICALS) ×3 IMPLANT
TUBE CONNECTING 12X1/4 (SUCTIONS) ×6 IMPLANT
TUBING INSUFFLATION 10FT LAP (TUBING) ×3 IMPLANT
WARMER LAPAROSCOPE (MISCELLANEOUS) ×3 IMPLANT
WATER STERILE IRR 500ML POUR (IV SOLUTION) ×3 IMPLANT
YANKAUER SUCT BULB TIP NO VENT (SUCTIONS) ×3 IMPLANT

## 2015-09-17 NOTE — Anesthesia Postprocedure Evaluation (Signed)
Anesthesia Post Note  Patient: Yvette [name]  Procedure(s) Performed: Procedure(s) (LRB): LAPAROSCOPIC ASSISTED VAGINAL HYSTERECTOMY (N/A) BILATERAL SALPINGECTOMY (Bilateral)  Patient location during evaluation: PACU Anesthesia Type: General Level of consciousness: awake and alert Pain management: pain level controlled Vital Signs Assessment: post-procedure vital signs reviewed and stable Respiratory status: spontaneous breathing, nonlabored ventilation, respiratory function stable and patient connected to nasal cannula oxygen Cardiovascular status: blood pressure returned to baseline and stable Postop Assessment: no signs of nausea or vomiting Anesthetic complications: no    Last Vitals:  Filed Vitals:   09/17/15 0945 09/17/15 0949  BP: 116/72   Pulse: 64 51  Temp:    Resp: 14 9    Last Pain:  Filed Vitals:   09/17/15 0952  PainSc: 7                  Sebastian Acheheodore Zakhari Fogel

## 2015-09-17 NOTE — Anesthesia Procedure Notes (Signed)
Procedure Name: Intubation Date/Time: 09/17/2015 7:35 AM Performed by: Jessica PriestBEESON, Yvette Fronczak C Pre-anesthesia Checklist: Patient identified, Emergency Drugs available, Suction available and Patient being monitored Patient Re-evaluated:Patient Re-evaluated prior to inductionOxygen Delivery Method: Circle system utilized Preoxygenation: Pre-oxygenation with 100% oxygen Intubation Type: IV induction Ventilation: Mask ventilation without difficulty Laryngoscope Size: Mac and 3 Grade View: Grade II Tube type: Oral Tube size: 7.0 mm Number of attempts: 1 Airway Equipment and Method: Stylet and Oral airway Placement Confirmation: ETT inserted through vocal cords under direct vision,  positive ETCO2 and breath sounds checked- equal and bilateral Secured at: 21 cm Tube secured with: Tape Dental Injury: Teeth and Oropharynx as per pre-operative assessment

## 2015-09-17 NOTE — Transfer of Care (Signed)
Immediate Anesthesia Transfer of Care Note  Patient: Yvette RiisKatina E Murillo  Procedure(s) Performed: Procedure(s) (LRB): LAPAROSCOPIC ASSISTED VAGINAL HYSTERECTOMY (N/A) BILATERAL SALPINGECTOMY (Bilateral)  Patient Location: PACU  Anesthesia Type: General  Level of Consciousness: awake, sedated, patient cooperative and responds to stimulation  Airway & Oxygen Therapy: Patient Spontanous Breathing and Patient connected to face mask oxygen  Post-op Assessment: Report given to PACU RN, Post -op Vital signs reviewed and stable and Patient moving all extremities  Post vital signs: Reviewed and stable  Complications: No apparent anesthesia complications

## 2015-09-17 NOTE — Op Note (Signed)
Preoperative diagnosis: Menorrhagia, dysmenorrhea, probable adenomyosis  Postoperative diagnosis: Same  Procedure: LAVH, bilateral salpingectomy  Surgeon: Marcelle OverlieHolland  Asst.: Jennette KettleNeal  EBL: 300 cc  Procedure and findings:  Patient taken the operating room after an adequate level of general anesthesia was obtained the patient's legs in stirrups the abdomen perineum and vagina were prepped and draped and the bladder was drained. EUA was carried out the uterus was normal size, mobile, adnexa negative, Conn cannula was positioned. This was done after appropriate timeouts were taken.  Attention directed to the abdomen where the subumbilical skin was infiltrated with quarter percent Marcaine, small incision was made in the varies needle was introduced without difficulty. Its intra-abdominal position was verified by pressure water testing. After a 3 L pneumoperitoneum syncopated, lap scopic trocar and sleeve were then introduced without difficulty. There was no evidence of any bleeding or trauma. 3 finger breaths above the symphysis in the midline a 5 mm trocar was inserted under direct visualization. The patient was then placed in Trendelenburg and the pelvic findings as follows:  The uterus itself was normal size, mid to posterior in position anterior posterior cul-de-sac spaces were free and clear bilateral adnexa unremarkable the appendix and upper abdomen appear to be normal. Using an atraumatic grasping forcep, the right tube was grasped at the fimbriated and was placed on traction toward the midline, the Enseal device was then used to coagulate and divide the mesosalpinx, once this was completed the utero-ovarian pedicle was coagulated and divided down to and including the round ligament on the right side this was hemostatic the exact same repeated on the opposite side, thus both ovaries were conserved.  The vaginal portion the procedure started at this point.  Patient's legs were elevated, weighted  speculum was positioned cervix grasped with a tenaculum, cervical vaginal mucosa was incised, posterior culdotomy performed without difficulty. The anterior reflection was advanced superiorly with sharp and blunt dissection once the peritoneum was identified this was entered sharply and a retractor then used to gently elevate the bladder out of the field. The LigaSure device was then used to coagulate and divide first the uterosacral ligament, cardinal ligament, uterine vasculature pedicle and upper broad ligament pedicles staying close to the uterus. These areas were hemostatic. The fundus of the uterus is in delivered posteriorly remaining pedicles were coagulated and divided. Specimen was removed, the vaginal cuff was then closed from 3 to 9:00 with a running locked 2-0 Vicryl suture. A McCall's culdoplasty suture was placed picking up left uterosacral ligament posterior peritoneum across to the right uterosacral ligament which was then tied down for extra posterior support. Prior to closure sponge, needle, history counts reported as correct 2. Vaginal cuff closed right to left with interrupted 2-0 Vicryl sutures. This was hemostatic Foley catheter positioned draining clear urine.  Repeat laparoscopy was carried out, the Nezhat was used to irrigate the operative site which was then aspirated at reduced pressure noted to be hemostatic. Instruments were removed, defect closed with 40 was subcuticular at the umbilicus and Dermabond at the lower. She tolerated this well went to recovery room in good condition.  Dictated with [facility]  Yvette [name] Yvette ObeyM Yvette [name] M.D.

## 2015-09-17 NOTE — Progress Notes (Signed)
The patient was re-examined with no change in status 

## 2015-09-18 ENCOUNTER — Encounter (HOSPITAL_BASED_OUTPATIENT_CLINIC_OR_DEPARTMENT_OTHER): Payer: Self-pay | Admitting: Obstetrics and Gynecology

## 2015-09-18 DIAGNOSIS — N920 Excessive and frequent menstruation with regular cycle: Secondary | ICD-10-CM | POA: Diagnosis not present

## 2015-09-18 LAB — CBC
HCT: 27.1 % — ABNORMAL LOW (ref 36.0–46.0)
Hemoglobin: 9.1 g/dL — ABNORMAL LOW (ref 12.0–15.0)
MCH: 30 pg (ref 26.0–34.0)
MCHC: 33.6 g/dL (ref 30.0–36.0)
MCV: 89.4 fL (ref 78.0–100.0)
Platelets: 207 10*3/uL (ref 150–400)
RBC: 3.03 MIL/uL — ABNORMAL LOW (ref 3.87–5.11)
RDW: 12.7 % (ref 11.5–15.5)
WBC: 10.9 10*3/uL — ABNORMAL HIGH (ref 4.0–10.5)

## 2015-09-18 MED ORDER — OXYCODONE-ACETAMINOPHEN 5-325 MG PO TABS: 1.0000 | ORAL_TABLET | ORAL | Status: DC | PRN

## 2015-09-18 MED ORDER — IBUPROFEN 800 MG PO TABS: 800.0000 mg | ORAL_TABLET | Freq: Three times a day (TID) | ORAL | Status: DC | PRN

## 2015-09-18 MED ORDER — FERROUS SULFATE 325 (65 FE) MG PO TABS: 325.0000 mg | ORAL_TABLET | Freq: Every day | ORAL | Status: DC

## 2015-09-18 NOTE — Progress Notes (Signed)
After pt dc, 17 ml of Morphine PCA 2mg /351ml wasted in sink with Chasity Hearn, RN-ADN at 1345.

## 2015-09-18 NOTE — Discharge Summary (Signed)
Physician Discharge Summary  Patient ID: Yvette [name] MRN: [id number] DOB/AGE: 45/09/1970 45 y.o.  Admit date: 09/17/2015 Discharge date: 09/18/2015  Admission Diagnoses:menorrhagia, dysmenorrhea, adenomyosis  Discharge Diagnoses: same Active Problems:   Menorrhagia   Discharged Condition: good  Hospital Course: adm for LAVH/bilat salpingectomy, on POD 1 was afeb, tol PO and ready for D/C  Consults: None  Significant Diagnostic Studies: labs:  CBC    Component Value Date/Time   WBC 10.9* 09/18/2015 0550   RBC 3.03* 09/18/2015 0550   HGB 9.1* 09/18/2015 0550   HCT 27.1* 09/18/2015 0550   PLT 207 09/18/2015 0550   MCV 89.4 09/18/2015 0550   MCH 30.0 09/18/2015 0550   MCHC 33.6 09/18/2015 0550   RDW 12.7 09/18/2015 0550   LYMPHSABS 2.4 12/02/2014 1423   MONOABS 0.5 12/02/2014 1423   EOSABS 0.1 12/02/2014 1423   BASOSABS 0.0 12/02/2014 1423      Treatments: surgery: LAVH  Discharge Exam: Blood pressure 131/68, pulse 71, temperature 98.4 F (36.9 C), temperature source Oral, resp. rate 15, height 5\' 2"  (1.575 [name]), weight 152 lb (68.947 kg), last menstrual period 08/15/2015, SpO2 100 %. abd soft + BS. incs C/D  Disposition: 01-Home or Self Care     Medication List    TAKE these medications        clonazePAM 0.5 MG tablet  Commonly known as:  KLONOPIN  Take 0.5 mg by mouth every morning.     ferrous sulfate 325 (65 FE) MG tablet  Commonly known as:  FERROUSUL  Take 1 tablet (325 mg total) by mouth daily with breakfast.     fish oil-omega-3 fatty acids 1000 MG capsule  Take 1 g by mouth daily.     ibuprofen 800 MG tablet  Commonly known as:  ADVIL,MOTRIN  Take 1 tablet (800 mg total) by mouth every 8 (eight) hours as needed for moderate pain (mild pain).  Start taking on:  09/22/2015     oxyCODONE-acetaminophen 5-325 MG tablet  Commonly known as:  PERCOCET/ROXICET  Take 1-2 tablets by mouth every 4 (four) hours as needed for severe pain (moderate to  severe pain (when tolerating fluids)).     sennosides-docusate sodium 8.6-50 MG tablet  Commonly known as:  SENOKOT-S  Take 1 tablet by mouth daily.         SignedMeriel Pica: Yvette [name] 09/18/2015, 7:49 AM

## 2015-09-18 NOTE — Progress Notes (Signed)
Assessment unchanged. VSS. No complaints voiced. Pt verbalized understanding of dc instructions through teach back including follow up care and when to call the doctor. Script x 1 given as provided by MD. Discharged via wc to front entrance to meet awaiting vehicle to carry home. Accompanied by mother and [employer] student nurse.

## 2015-09-22 ENCOUNTER — Inpatient Hospital Stay (HOSPITAL_COMMUNITY)
Admission: AD | Admit: 2015-09-22 | Discharge: 2015-09-23 | Disposition: A | Discharge status: Home / Self Care | Payer: BLUE CROSS/BLUE SHIELD | Source: Ambulatory Visit | Attending: Obstetrics and Gynecology | Admitting: Obstetrics and Gynecology

## 2015-09-22 ENCOUNTER — Encounter (HOSPITAL_COMMUNITY): Payer: Self-pay | Admitting: *Deleted

## 2015-09-22 DIAGNOSIS — K5909 Other constipation: Secondary | ICD-10-CM | POA: Insufficient documentation

## 2015-09-22 DIAGNOSIS — IMO0002 Reserved for concepts with insufficient information to code with codable children: Secondary | ICD-10-CM

## 2015-09-22 DIAGNOSIS — M79605 Pain in left leg: Secondary | ICD-10-CM

## 2015-09-22 DIAGNOSIS — M79604 Pain in right leg: Secondary | ICD-10-CM

## 2015-09-22 DIAGNOSIS — Z90710 Acquired absence of both cervix and uterus: Secondary | ICD-10-CM | POA: Insufficient documentation

## 2015-09-22 DIAGNOSIS — K589 Irritable bowel syndrome without diarrhea: Secondary | ICD-10-CM | POA: Insufficient documentation

## 2015-09-22 DIAGNOSIS — N939 Abnormal uterine and vaginal bleeding, unspecified: Secondary | ICD-10-CM | POA: Insufficient documentation

## 2015-09-22 NOTE — MAU Note (Signed)
Had lap hysterectomy on 6/19. Tonight had some vag bleeding and small clot. Some pain in calves of both legs and they feel tight.

## 2015-09-22 NOTE — MAU Provider Note (Signed)
MAU HISTORY AND PHYSICAL  Chief Complaint:  Bleeding and leg pain.  Yvette [name] is a 45 y.o.  G4 presenitng with the above.  On 6/19 had lap tvh and bs for menorrhagia. Here today with vaginal bleeding, spotting. Mild pain. No fever, no n/v.   Since returning home bilateral leg pain. Lower legs. Equal both sides. Achy. Mild. No swelling or redness. No dyspnea.     Past Medical History  Diagnosis Date  . IBS (irritable bowel syndrome)   . Menorrhagia   . OA (osteoarthritis)   . Chronic constipation     Past Surgical History  Procedure Laterality Date  . Hysteroscopy w/d&c  summer 2016  . Diagnostic laparoscopy  10-12-2000  . Laparoscopic assisted vaginal hysterectomy N/A 09/17/2015    Procedure: LAPAROSCOPIC ASSISTED VAGINAL HYSTERECTOMY;  Surgeon: Richarda Overlieichard Holland, MD;  [facility] [place];  Service: Gynecology;  Laterality: N/A;  . Bilateral salpingectomy Bilateral 09/17/2015    Procedure: BILATERAL SALPINGECTOMY;  Surgeon: Richarda Overlieichard Holland, MD;  [facility] [place];  Service: Gynecology;  Laterality: Bilateral;    Family History  Problem Relation Age of Onset  . Diabetes    . Colon cancer Neg Hx     Social History  Substance Use Topics  . Smoking status: Never Smoker   . Smokeless tobacco: Never Used  . Alcohol Use: Yes     Comment: OCCASIONAL    Allergies  Allergen Reactions  . Doxycycline Rash    Prescriptions prior to admission  Medication Sig Dispense Refill Last Dose  . clonazePAM (KLONOPIN) 0.5 MG tablet Take 0.5 mg by mouth every morning.    Past Week at Unknown time  . ferrous sulfate (FERROUSUL) 325 (65 FE) MG tablet Take 1 tablet (325 mg total) by mouth daily with breakfast. 30 tablet 0 09/22/2015 at Unknown time  . fish oil-omega-3 fatty acids 1000 MG capsule Take 1 g by mouth daily.    Past Week at Unknown time  . ibuprofen (ADVIL,MOTRIN) 800 MG tablet Take 1 tablet (800 mg total) by mouth every 8 (eight) hours as  needed for moderate pain (mild pain). 30 tablet 1 09/21/2015 at Unknown time  . oxyCODONE-acetaminophen (PERCOCET/ROXICET) 5-325 MG tablet Take 1-2 tablets by mouth every 4 (four) hours as needed for severe pain (moderate to severe pain (when tolerating fluids)). 30 tablet 0 09/22/2015 at Unknown time  . sennosides-docusate sodium (SENOKOT-S) 8.6-50 MG tablet Take 2 tablets by mouth 2 (two) times daily.    09/16/2015 at Unknown time    Review of Systems - Negative except for what is mentioned in HPI.  Physical Exam  Blood pressure 141/81, pulse 73, temperature 98.3 F (36.8 C), resp. rate 18, height 5\' 2"  (1.575 m), weight 153 lb 12.8 oz (69.763 kg), last menstrual period 08/15/2015. GENERAL: Well-developed, well-nourished female in no acute distress.  LUNGS: Clear to auscultation bilaterally.  HEART: Regular rate and rhythm. ABDOMEN: Soft, nontender, nondistended EXTREMITIES: Nontender, no edema, 2+ distal pulses. No redness or palpable cords. GU: no blood in vagina, sutures at cuff with small amount of blood at superior suture, no significant tenderness on exam  Labs: No results found for this or any previous visit (from the past 24 hour(s)).  Imaging Studies:  No results found.  Assessment/Plan: Yvette RiisKatina E Lightsey is  45 y.o. G4 who is pod6 from lavh. Presenting with spotting. Bleeding is mild, patient is afebrile without other signs/symptoms infection. No abdominal tenderness. Discussed w/ Dr. Vincente PoliGrewal. Possible small hematoma or suture  irritation. Given that patient is well-appearing, bleeding is mild, and no signs/symptoms infection, will d/c home with f/u already scheduled tomorrow and bleeding/infection return precautions. Do not think leg pain is suggestive of dvt as legs are non-tender, no edema, no redness or palpable cord, and no symptoms pe. i have, however, discussed the signs/symptoms of dvt and pe.   Silvano Bilisoah B Wouk 6/24/201711:56 PM

## 2015-09-23 DIAGNOSIS — K5909 Other constipation: Secondary | ICD-10-CM | POA: Diagnosis not present

## 2015-09-23 DIAGNOSIS — M79605 Pain in left leg: Secondary | ICD-10-CM | POA: Diagnosis not present

## 2015-09-23 DIAGNOSIS — M79604 Pain in right leg: Secondary | ICD-10-CM

## 2015-09-23 DIAGNOSIS — N920 Excessive and frequent menstruation with regular cycle: Secondary | ICD-10-CM | POA: Diagnosis present

## 2015-09-23 DIAGNOSIS — M79606 Pain in leg, unspecified: Secondary | ICD-10-CM | POA: Diagnosis present

## 2015-09-23 DIAGNOSIS — N99821 Postprocedural hemorrhage and hematoma of a genitourinary system organ or structure following other procedure: Secondary | ICD-10-CM

## 2015-09-23 DIAGNOSIS — N939 Abnormal uterine and vaginal bleeding, unspecified: Secondary | ICD-10-CM | POA: Diagnosis not present

## 2015-09-23 DIAGNOSIS — Z90710 Acquired absence of both cervix and uterus: Secondary | ICD-10-CM | POA: Diagnosis not present

## 2015-09-23 DIAGNOSIS — K589 Irritable bowel syndrome without diarrhea: Secondary | ICD-10-CM | POA: Diagnosis not present

## 2015-09-23 NOTE — Discharge Instructions (Signed)
Seek medical attention for heavy vaginal bleeding, fever, worsening abdominal/pelvic pain, foul-smelling vaginal discharge, or for any other significant concern

## 2015-09-23 NOTE — Progress Notes (Signed)
Dr Ashok PallWouk in to discuss d/c plan. Written and verbal d/c instructions given and understanding voiced

## 2016-01-23 ENCOUNTER — Other Ambulatory Visit: Payer: Self-pay

## 2016-01-23 ENCOUNTER — Ambulatory Visit: Payer: BLUE CROSS/BLUE SHIELD | Admitting: Nurse Practitioner

## 2016-06-17 ENCOUNTER — Other Ambulatory Visit: Payer: Self-pay | Admitting: Gastroenterology

## 2016-06-17 ENCOUNTER — Ambulatory Visit
Admission: RE | Admit: 2016-06-17 | Discharge: 2016-06-17 | Disposition: A | Discharge status: Home / Self Care | Payer: PRIVATE HEALTH INSURANCE | Source: Ambulatory Visit | Attending: Gastroenterology | Admitting: Gastroenterology

## 2016-06-17 DIAGNOSIS — K5909 Other constipation: Secondary | ICD-10-CM

## 2016-12-02 ENCOUNTER — Other Ambulatory Visit: Payer: Self-pay | Admitting: Rheumatology

## 2016-12-02 DIAGNOSIS — M549 Dorsalgia, unspecified: Secondary | ICD-10-CM

## 2016-12-02 DIAGNOSIS — M25559 Pain in unspecified hip: Secondary | ICD-10-CM

## 2016-12-15 ENCOUNTER — Other Ambulatory Visit: Payer: PRIVATE HEALTH INSURANCE

## 2017-03-14 ENCOUNTER — Emergency Department (HOSPITAL_COMMUNITY): Payer: PRIVATE HEALTH INSURANCE

## 2017-03-14 ENCOUNTER — Emergency Department (HOSPITAL_COMMUNITY)
Admission: EM | Admit: 2017-03-14 | Discharge: 2017-03-14 | Disposition: A | Discharge status: Home / Self Care | Payer: PRIVATE HEALTH INSURANCE | Attending: Emergency Medicine | Admitting: Emergency Medicine

## 2017-03-14 ENCOUNTER — Encounter (HOSPITAL_COMMUNITY): Payer: Self-pay

## 2017-03-14 DIAGNOSIS — R42 Dizziness and giddiness: Secondary | ICD-10-CM | POA: Diagnosis not present

## 2017-03-14 DIAGNOSIS — R079 Chest pain, unspecified: Secondary | ICD-10-CM | POA: Diagnosis not present

## 2017-03-14 DIAGNOSIS — M549 Dorsalgia, unspecified: Secondary | ICD-10-CM | POA: Insufficient documentation

## 2017-03-14 DIAGNOSIS — Z79899 Other long term (current) drug therapy: Secondary | ICD-10-CM | POA: Insufficient documentation

## 2017-03-14 LAB — COMPREHENSIVE METABOLIC PANEL
ALT: 9 U/L — ABNORMAL LOW (ref 14–54)
AST: 20 U/L (ref 15–41)
Albumin: 3.8 g/dL (ref 3.5–5.0)
Alkaline Phosphatase: 64 U/L (ref 38–126)
Anion gap: 5 (ref 5–15)
BUN: 13 mg/dL (ref 6–20)
CO2: 29 mmol/L (ref 22–32)
Calcium: 9.2 mg/dL (ref 8.9–10.3)
Chloride: 105 mmol/L (ref 101–111)
Creatinine, Ser: 0.75 mg/dL (ref 0.44–1.00)
GFR calc Af Amer: >60 mL/min (ref >60)
GFR calc non Af Amer: >60 mL/min (ref >60)
Glucose, Bld: 102 mg/dL — ABNORMAL HIGH (ref 65–99)
Potassium: 3.9 mmol/L (ref 3.5–5.1)
Sodium: 139 mmol/L (ref 135–145)
Total Bilirubin: 0.4 mg/dL (ref 0.3–1.2)
Total Protein: 7.4 g/dL (ref 6.5–8.1)

## 2017-03-14 LAB — LIPASE, BLOOD: Lipase: 43 U/L (ref 11–51)

## 2017-03-14 LAB — CBC WITH DIFFERENTIAL/PLATELET
Basophils Absolute: 0 10*3/uL (ref 0.0–0.1)
Basophils Relative: 1 %
Eosinophils Absolute: 0 10*3/uL (ref 0.0–0.7)
Eosinophils Relative: 0 %
HCT: 36.5 % (ref 36.0–46.0)
Hemoglobin: 12.1 g/dL (ref 12.0–15.0)
Lymphocytes Relative: 33 %
Lymphs Abs: 1.9 10*3/uL (ref 0.7–4.0)
MCH: 30.5 pg (ref 26.0–34.0)
MCHC: 33.2 g/dL (ref 30.0–36.0)
MCV: 91.9 fL (ref 78.0–100.0)
Monocytes Absolute: 0.3 10*3/uL (ref 0.1–1.0)
Monocytes Relative: 5 %
Neutro Abs: 3.6 10*3/uL (ref 1.7–7.7)
Neutrophils Relative %: 61 %
Platelets: 300 10*3/uL (ref 150–400)
RBC: 3.97 MIL/uL (ref 3.87–5.11)
RDW: 12.7 % (ref 11.5–15.5)
WBC: 5.9 10*3/uL (ref 4.0–10.5)

## 2017-03-14 LAB — I-STAT BETA HCG BLOOD, ED (MC, WL, AP ONLY): I-stat hCG, quantitative: <5 m[IU]/mL (ref <5)

## 2017-03-14 LAB — URINALYSIS, ROUTINE W REFLEX MICROSCOPIC
Bilirubin Urine: NEGATIVE
Glucose, UA: NEGATIVE mg/dL
Ketones, ur: NEGATIVE mg/dL
Leukocytes, UA: NEGATIVE
Nitrite: NEGATIVE
Protein, ur: NEGATIVE mg/dL
Specific Gravity, Urine: 1.019 (ref 1.005–1.030)
pH: 5 (ref 5.0–8.0)

## 2017-03-14 LAB — I-STAT TROPONIN, ED: Troponin i, poc: 0.01 ng/mL (ref 0.00–0.08)

## 2017-03-14 MED ORDER — NAPROXEN 500 MG PO TABS: 500.0000 mg | ORAL_TABLET | Freq: Two times a day (BID) | ORAL | 0 refills | Status: DC | PRN

## 2017-03-14 MED ORDER — IOPAMIDOL (ISOVUE-370) INJECTION 76%
100.0000 mL | Freq: Once | INTRAVENOUS | Status: AC | PRN
Start: 2017-03-14 — End: 2017-03-14
  Administered 2017-03-14: 100 mL via INTRAVENOUS

## 2017-03-14 MED ORDER — IOPAMIDOL (ISOVUE-370) INJECTION 76%
INTRAVENOUS | Status: AC
  Filled 2017-03-14: qty 100

## 2017-03-14 MED ORDER — NAPROXEN 500 MG PO TABS
500.0000 mg | ORAL_TABLET | Freq: Once | ORAL | Status: AC
  Administered 2017-03-14: 500 mg via ORAL
  Filled 2017-03-14: qty 1

## 2017-03-14 MED ORDER — METHOCARBAMOL 500 MG PO TABS: 500.0000 mg | ORAL_TABLET | Freq: Two times a day (BID) | ORAL | 0 refills | Status: DC | PRN

## 2017-03-14 MED ORDER — MECLIZINE HCL 25 MG PO TABS
25.0000 mg | ORAL_TABLET | Freq: Once | ORAL | Status: AC
  Administered 2017-03-14: 25 mg via ORAL
  Filled 2017-03-14: qty 1

## 2017-03-14 MED ORDER — MECLIZINE HCL 25 MG PO TABS: 25.0000 mg | ORAL_TABLET | Freq: Three times a day (TID) | ORAL | 0 refills | Status: DC | PRN

## 2017-03-14 NOTE — ED Triage Notes (Signed)
Pt complains of middle back pain and dizziness for one week, no injury noted to her back Pt states that while she was waiting she started to have chest pain that she feels like is radiating from her back

## 2017-03-14 NOTE — ED Provider Notes (Signed)
[facility]-EMERGENCY DEPT Provider Note   CSN: [id number] Arrival date & time: 03/14/17  4098     History   Chief Complaint Chief Complaint  Patient presents with  . Back Pain    HPI Yvette [name] is a 46 y.o. female with a history of IBS who presents to the emergency department complaining of back pain for the past 2 months and dizziness for the past 1 week.  Describes her back pain as being to the mid central location of her back  States it has been getting progressively worse over the past 2 months, occurring intermittently, not daily, lasting 1-2 hours and improving with 200-400 mg of ibuprofen until this morning when she tried 400 mg without relief.  She states that over the past 1 week the back pain has been occurring daily, current pain has been persistent since 0400 this AM, now describing it as a 10 out of 10 in severity. States it feels as though it is somewhat radiating to her mid chest now. Pain is not exertional or pleuritic in nature. Denies palpitations, dyspnea, vomiting, diaphoresis, leg pain, or leg swelling. No injury or change in activity. Upon further inquiry has noted some "bloating" over past few weeks, denies N/V/D, constipation, or abdominal pain.   Dizziness has been occurring intermittently for the past 1 week, patient states it is typically in the morning and in the evening, describes the dizziness as room spinning with an off-balance feeling.  Dizziness is triggered by head movements.  Feels somewhat similar to previous diagnosis of vertigo several years ago.  This morning had some associated nausea.  Denies vomiting, change in vision, numbness, weakness, or headaches.  No ear pain, tinnitus, or change in hearing. Patient states these two complaints do not seem to specifically occur together.   HPI  Past Medical History:  Diagnosis Date  . Chronic constipation   . IBS (irritable bowel syndrome)   . Menorrhagia   . OA (osteoarthritis)       Patient Active Problem List   Diagnosis Date Noted  . Menorrhagia 09/17/2015  . GERD (gastroesophageal reflux disease) 10/14/2013  . Other dysphagia 10/14/2013  . Hemorrhoids 09/28/2013  . Bloating 09/28/2013  . Unspecified constipation 09/28/2013    Past Surgical History:  Procedure Laterality Date  . BILATERAL SALPINGECTOMY Bilateral 09/17/2015   Procedure: BILATERAL SALPINGECTOMY;  Surgeon: Richarda Overlie, MD;  Location: [facility];  Service: Gynecology;  Laterality: Bilateral;  . DIAGNOSTIC LAPAROSCOPY  10-12-2000  . HYSTEROSCOPY W/D&C  summer 2016  . LAPAROSCOPIC ASSISTED VAGINAL HYSTERECTOMY N/A 09/17/2015   Procedure: LAPAROSCOPIC ASSISTED VAGINAL HYSTERECTOMY;  Surgeon: Richarda Overlie, MD;  Location: [facility];  Service: Gynecology;  Laterality: N/A;    OB History    Gravida Para Term Preterm AB Living   4 2       2    SAB TAB Ectopic Multiple Live Births                   Home Medications    Prior to Admission medications   Medication Sig Start Date End Date Taking? Authorizing Provider  clonazePAM (KLONOPIN) 0.5 MG tablet Take 0.5 mg by mouth every morning.     [provider]  ferrous sulfate (FERROUSUL) 325 (65 FE) MG tablet Take 1 tablet (325 mg total) by mouth daily with breakfast. 09/18/15   Richarda Overlie, MD  fish oil-omega-3 fatty acids 1000 MG capsule Take 1 g by mouth daily.  [provider]  ibuprofen (ADVIL,MOTRIN) 800 MG tablet Take 1 tablet (800 mg total) by mouth every 8 (eight) hours as needed for moderate pain (mild pain). 09/22/15   Richarda Overlie, MD  oxyCODONE-acetaminophen (PERCOCET/ROXICET) 5-325 MG tablet Take 1-2 tablets by mouth every 4 (four) hours as needed for severe pain (moderate to severe pain (when tolerating fluids)). 09/18/15   Richarda Overlie, MD  sennosides-docusate sodium (SENOKOT-S) 8.6-50 MG tablet Take 2 tablets by mouth 2 (two) times daily.     [provider]     Family History Family History  Problem Relation Age of Onset  . Diabetes Unknown   . Colon cancer Neg Hx     Social History Social History   Tobacco Use  . Smoking status: Never Smoker  . Smokeless tobacco: Never Used  Substance Use Topics  . Alcohol use: Yes    Comment: OCCASIONAL  . Drug use: No     Allergies   Doxycycline   Review of Systems Review of Systems  Constitutional: Negative for chills and fever.  HENT: Negative for congestion, dental problem, ear pain, hearing loss, rhinorrhea, sore throat and tinnitus.   Eyes: Negative for visual disturbance.  Respiratory: Negative for cough and shortness of breath.   Cardiovascular: Positive for chest pain. Negative for palpitations and leg swelling.  Gastrointestinal: Positive for nausea. Negative for abdominal pain, diarrhea and vomiting.       Positive for bloating sensation  Genitourinary: Negative for dysuria.  Musculoskeletal: Positive for back pain. Negative for neck pain and neck stiffness.  Skin: Negative for rash.  Neurological: Positive for dizziness. Negative for weakness, numbness and headaches.  All other systems reviewed and are negative.    Physical Exam Updated Vital Signs BP 127/81 (BP Location: Left Arm)   Pulse 68   Temp (!) 97.4 F (36.3 C) (Oral)   Resp 18   Ht 5\' 2"  (1.575 m)   Wt 69.4 kg (153 lb)   LMP 08/15/2015 (Approximate)   SpO2 100%   BMI 27.98 kg/m   Physical Exam  Constitutional: She appears well-developed and well-nourished.  Non-toxic appearance. No distress.  HENT:  Head: Normocephalic and atraumatic.  Right Ear: Tympanic membrane is not perforated, not erythematous, not retracted and not bulging.  Left Ear: Tympanic membrane is not perforated, not erythematous, not retracted and not bulging.  Nose: Nose normal.  Mouth/Throat: Uvula is midline and oropharynx is clear and moist.  Eyes: Conjunctivae and EOM are normal. Pupils are equal, round, and reactive to light.  Right eye exhibits no discharge. Left eye exhibits no discharge. Right eye exhibits no nystagmus. Left eye exhibits no nystagmus.  Neck: Normal range of motion. Neck supple. No spinous process tenderness present.  Cardiovascular: Normal rate and regular rhythm.  No murmur heard. Pulses:      Radial pulses are 2+ on the right side, and 2+ on the left side.       Dorsalis pedis pulses are 2+ on the right side, and 2+ on the left side.  Pulmonary/Chest: Breath sounds normal. No respiratory distress. She has no wheezes. She has no rales.  Abdominal: Soft. She exhibits no distension. There is no tenderness.  Musculoskeletal:  Back: No vertebral or paraspinal muscle tenderness.  Extremities: No bony tenderness.   Neurological: She is alert.  Alert. Clear speech. No facial droop. CNIII-XII are intact. Bilateral upper and lower extremities' sensation intact to sharp and dull touch. 5/5 grip strength bilaterally. 5/5 plantar and dorsi flexion bilaterally. Patellar DTRs  are 2+ and symmetric . Normal finger to nose bilaterally. Negative pronator drift. Gait is normal, patient is able to ambulate on her heels and toes.  Skin: Skin is warm and dry. No rash noted.  Psychiatric: She has a normal mood and affect. Her behavior is normal.  Nursing note and vitals reviewed.   ED Treatments / Results  Labs Results for orders placed or performed during the hospital encounter of 03/14/17  CBC with Differential  Result Value Ref Range   WBC 5.9 4.0 - 10.5 K/uL   RBC 3.97 3.87 - 5.11 MIL/uL   Hemoglobin 12.1 12.0 - 15.0 g/dL   HCT 74.2 59.5 - 63.8 %   MCV 91.9 78.0 - 100.0 fL   MCH 30.5 26.0 - 34.0 pg   MCHC 33.2 30.0 - 36.0 g/dL   RDW 75.6 43.3 - 29.5 %   Platelets 300 150 - 400 K/uL   Neutrophils Relative % 61 %   Neutro Abs 3.6 1.7 - 7.7 K/uL   Lymphocytes Relative 33 %   Lymphs Abs 1.9 0.7 - 4.0 K/uL   Monocytes Relative 5 %   Monocytes Absolute 0.3 0.1 - 1.0 K/uL   Eosinophils Relative 0 %    Eosinophils Absolute 0.0 0.0 - 0.7 K/uL   Basophils Relative 1 %   Basophils Absolute 0.0 0.0 - 0.1 K/uL  Comprehensive metabolic panel  Result Value Ref Range   Sodium 139 135 - 145 mmol/L   Potassium 3.9 3.5 - 5.1 mmol/L   Chloride 105 101 - 111 mmol/L   CO2 29 22 - 32 mmol/L   Glucose, Bld 102 (H) 65 - 99 mg/dL   BUN 13 6 - 20 mg/dL   Creatinine, Ser 1.88 0.44 - 1.00 mg/dL   Calcium 9.2 8.9 - 41.6 mg/dL   Total Protein 7.4 6.5 - 8.1 g/dL   Albumin 3.8 3.5 - 5.0 g/dL   AST 20 15 - 41 U/L   ALT 9 (L) 14 - 54 U/L   Alkaline Phosphatase 64 38 - 126 U/L   Total Bilirubin 0.4 0.3 - 1.2 mg/dL   GFR calc non Af Amer >60 >60 mL/min   GFR calc Af Amer >60 >60 mL/min   Anion gap 5 5 - 15  Lipase, blood  Result Value Ref Range   Lipase 43 11 - 51 U/L  Urinalysis, Routine w reflex microscopic  Result Value Ref Range   Color, Urine YELLOW YELLOW   APPearance CLEAR CLEAR   Specific Gravity, Urine 1.019 1.005 - 1.030   pH 5.0 5.0 - 8.0   Glucose, UA NEGATIVE NEGATIVE mg/dL   Hgb urine dipstick SMALL (A) NEGATIVE   Bilirubin Urine NEGATIVE NEGATIVE   Ketones, ur NEGATIVE NEGATIVE mg/dL   Protein, ur NEGATIVE NEGATIVE mg/dL   Nitrite NEGATIVE NEGATIVE   Leukocytes, UA NEGATIVE NEGATIVE   RBC / HPF 0-5 0 - 5 RBC/hpf   WBC, UA 0-5 0 - 5 WBC/hpf   Bacteria, UA RARE (A) NONE SEEN   Squamous Epithelial / LPF 0-5 (A) NONE SEEN   Mucus PRESENT   I-stat troponin, ED  Result Value Ref Range   Troponin i, poc 0.01 0.00 - 0.08 ng/mL   Comment 3          I-Stat beta hCG blood, ED  Result Value Ref Range   I-stat hCG, quantitative <5.0 <5 mIU/mL   Comment 3            EKG  EKG Interpretation None  Radiology Ct Angio Chest Pe W And/or Wo Contrast  Result Date: 03/14/2017 CLINICAL DATA:  Mid back pain and dizziness for 1 week.  No trauma. EXAM: CT ANGIOGRAPHY CHEST WITH CONTRAST TECHNIQUE: Multidetector CT imaging of the chest was performed using the standard protocol during  bolus administration of intravenous contrast. Multiplanar CT image reconstructions and MIPs were obtained to evaluate the vascular anatomy. CONTRAST:  100mL ISOVUE-370 IOPAMIDOL (ISOVUE-370) INJECTION 76% COMPARISON:  KUB and chest x-ray March 14, 2017 FINDINGS: Cardiovascular: The heart size is normal. No obvious coronary artery calcifications identified. Evaluation of the thoracic aorta is limited due to cardiac motion but there is no evidence of aneurysm, dissection, or atherosclerosis. There is an aberrant origin of the right subclavian artery which passes behind the esophagus. Evaluation for emboli is mildly limited due to respiratory motion. No pulmonary emboli identified. Mediastinum/Nodes: No enlarged mediastinal, hilar, or axillary lymph nodes. Thyroid gland, trachea, and esophagus demonstrate no significant findings. Lungs/Pleura: Lungs are clear. No pleural effusion or pneumothorax. Upper Abdomen: No acute abnormality. Musculoskeletal: No chest wall abnormality. No acute or significant osseous findings. Review of the MIP images confirms the above findings. IMPRESSION: 1. No pulmonary emboli.  No cause for back pain identified. Electronically Signed   By: Gerome Samavid  Williams III M.D   On: 03/14/2017 11:21   Dg Abd Acute W/chest  Result Date: 03/14/2017 CLINICAL DATA:  Chest pain EXAM: DG ABDOMEN ACUTE W/ 1V CHEST COMPARISON:  06/17/2016 FINDINGS: Cardiac shadow is within normal limits. Lungs are clear bilaterally. No acute bony abnormality is noted. Scattered large and small bowel gas is noted. Tiny calcifications are noted over the kidneys bilaterally consistent. No definitive ureteral stone is seen. No bony abnormality is noted. IMPRESSION: Small nonobstructing renal calculi bilaterally. Electronically Signed   By: Alcide CleverMark  Lukens M.D.   On: 03/14/2017 09:34    Procedures Procedures (including critical care time)  Medications Ordered in ED Medications  meclizine (ANTIVERT) tablet 25 mg (25 mg  Oral Given 03/14/17 0824)  iopamidol (ISOVUE-370) 76 % injection 100 mL (100 mLs Intravenous Contrast Given 03/14/17 1050)  naproxen (NAPROSYN) tablet 500 mg (500 mg Oral Given 03/14/17 1159)    Initial Impression / Assessment and Plan / ED Course  I have reviewed the triage vital signs and the nursing notes.  Pertinent labs & imaging results that were available during my care of the patient were reviewed by me and considered in my medical decision making (see chart for details).   Patient presents with separate complaints today including back pain and dizziness.  She is nontoxic-appearing, in no apparent distress, and with stable vital signs.Given back discomfort without injury and hx of GI problems will evaluate with screening labs and abdominal x-ray. Given pain is radiating to chest will evaluate with EKG, CXR, and troponin. Screening labs grossly unremarkable. Given chest/back discomfort is nonexertional, no evidence of ST segment changes or arrhythmia on EKG, and troponin negative after >4 hours of pain, doubt ACS. Patient with stable vitals, 2+ symmetric radial and DP pulses bilaterally, no neurologic deficits on exam, and no mediastinum widening on CXR, doubt dissection.   Patient's dizziness is triggered with head position changes with a hx of similar and dx of vertigo. Urine is not overly concentrated, orthostatic vitals were unremarkable, doubt orthostatic hypotension. EKG without evidence of ST/T wave changes or arrhythmia. No nystagmus, specifically no vertical or rotational nystagmus. No neurologic deficits on exam, normal finger to nose. Patient is able to ambulate without difficulty.  Doubt CVA  or other central cause of vertigo.  History and physical appear to be consistent with peripheral vertigo symptoms.  Will treat with Meclizine in the ED.   Findings discussed with supervising physician Dr. Ranae PalmsYelverton who personally evaluated and examined this patient. CTA of the chest ordered to  evaluate for pulmonary embolism, this was negative for pulmonary embolism with no cause for back pain identified on CT scan.   Suspect dizziness to be peripheral vertigo related and back discomfort to be muscle related in nature, but without definitive etiology at this time.  On re-evaluation patient states she is feeling somewhat improved following meclizine. Back pain is a 5/10 in severity, improved from a 10/10- will give Naproxen in the ED. Will treat symptomatically with Meclizine for dizziness and Naproxen and Robaxin for back discomfort- discussed with patient that she is not to drive or operate heavy machinery when taking robaxin. I discussed results, treatment plan, need for PCP follow-up, and return precautions with the patient and her husband. Provided opportunity for questions, patient confirmed understanding and is in agreement with plan.   Findings and plan of care discussed with supervising physician Dr. Ranae PalmsYelverton who is in agreement.   Final Clinical Impressions(s) / ED Diagnoses   Final diagnoses:  Acute back pain, unspecified back location, unspecified back pain laterality  Dizziness    ED Discharge Orders        Ordered    meclizine (ANTIVERT) 25 MG tablet  3 times daily PRN     03/14/17 1154    naproxen (NAPROSYN) 500 MG tablet  2 times daily PRN     03/14/17 1154    methocarbamol (ROBAXIN) 500 MG tablet  2 times daily PRN     03/14/17 1154       Lenoir Facchini, EmpireSamantha R, PA-C 03/15/17 1428    Loren RacerYelverton, David, MD 03/18/17 [phone]

## 2017-03-14 NOTE — ED Notes (Signed)
Pt c/o mid thoracic back pain that started Saturday and dizziness for a week. Hx of arthritis.

## 2017-03-14 NOTE — Discharge Instructions (Signed)
You were seen in the emergency department for back pain and dizziness. Your lab work did not show any problems with your kidney, function, liver function or electrolytes. There were no signs of infection or anemia. Your EKG did not show any concerning changes. The x-ray of your chest and abdomen did not show any abnormalities. The CT scan of your chest did not show any abnormalities.  You received meclizine, a medicine used to treat dizziness, and naproxen, an anti-inflammatory medicine used to treat pain and swelling while in the emergency department.  I have given you 3 prescriptions: - Meclizine- Meclizine is a medicine used to treat dizziness. You may take 1 tablet of this as needed every 8 hours for dizziness.  - Naproxen- Naproxen is a nonsteroidal anti-inflammatory medication that will help with pain and swelling. Be sure to take this medication as prescribed with food, 1 pill every 12 hours,  It should be taken with food, as it can cause stomach upset, and more seriously, stomach bleeding. Do not take other nonsteroidal anti-inflammatory medications with this such as Advil, Motrin, or Aleve.  - Robaxin- Robaxin is the muscle relaxer I have prescribed, this is meant to help with muscle tightness. Be aware that this medication may make you drowsy therefore the first time you take this it should be at a time you are in an environment where you can rest. Do not drive or operate heavy machinery when taking this medication.   In addition you may also take Tylenol. Tylenol is generally safe, though you should not take more than 8 of the extra strength (500mg ) pills a day. You may apply heat or ice to your back whichever feels better.   I have prescribed a new medication for you today. It is important that when you pick the prescription up you discuss the potential interactions of this medication with other medications you are taking, including over the counter medications, with the pharmacists.   These  new medications have potential side effects. Be sure to contact your primary care provider or return to the emergency department if you are experiencing new symptoms that you are unable to tolerate after starting the medication. You need to receive medical evaluation immediately if you start to experience blistering of the skin, rash, swelling, or difficulty breathing as these signs could indicate a more serious medication side effect.    Follow up with your primary care provider in the next 5 days for re-evaluation. If you develop any new/worsening/concerning symptoms including, but not limited to severe or worsening pain, low back pain with fever, numbness, weakness, loss of bowel or bladder control, or inability to walk or urinate,you should return to the ER immediately.

## 2017-03-14 NOTE — ED Notes (Signed)
Pt tol orthostatic VS, states she just feels a little light headed

## 2017-03-15 LAB — URINE CULTURE: Culture: NO GROWTH

## 2017-04-23 ENCOUNTER — Other Ambulatory Visit: Payer: Self-pay | Admitting: Rheumatology

## 2017-04-23 DIAGNOSIS — M25559 Pain in unspecified hip: Secondary | ICD-10-CM

## 2017-04-29 ENCOUNTER — Ambulatory Visit
Admission: RE | Admit: 2017-04-29 | Discharge: 2017-04-29 | Disposition: A | Discharge status: Home / Self Care | Payer: No Typology Code available for payment source | Source: Ambulatory Visit | Attending: Rheumatology | Admitting: Rheumatology

## 2017-04-29 DIAGNOSIS — M25559 Pain in unspecified hip: Secondary | ICD-10-CM

## 2017-04-29 MED ORDER — GADOBENATE DIMEGLUMINE 529 MG/ML IV SOLN
15.0000 mL | Freq: Once | INTRAVENOUS | Status: AC | PRN
  Administered 2017-04-29: 15 mL via INTRAVENOUS

## 2017-05-25 ENCOUNTER — Other Ambulatory Visit: Payer: Self-pay | Admitting: Rheumatology

## 2017-05-25 DIAGNOSIS — M545 Low back pain: Secondary | ICD-10-CM

## 2017-05-25 DIAGNOSIS — R700 Elevated erythrocyte sedimentation rate: Secondary | ICD-10-CM

## 2017-06-03 ENCOUNTER — Ambulatory Visit
Admission: RE | Admit: 2017-06-03 | Discharge: 2017-06-03 | Disposition: A | Discharge status: Home / Self Care | Payer: No Typology Code available for payment source | Source: Ambulatory Visit | Attending: Rheumatology | Admitting: Rheumatology

## 2017-06-03 DIAGNOSIS — M545 Low back pain: Secondary | ICD-10-CM

## 2017-06-03 DIAGNOSIS — R700 Elevated erythrocyte sedimentation rate: Secondary | ICD-10-CM

## 2017-06-03 MED ORDER — GADOBENATE DIMEGLUMINE 529 MG/ML IV SOLN
15.0000 mL | Freq: Once | INTRAVENOUS | Status: AC | PRN
  Administered 2017-06-03: 15 mL via INTRAVENOUS

## 2017-09-08 DIAGNOSIS — N390 Urinary tract infection, site not specified: Secondary | ICD-10-CM | POA: Diagnosis not present

## 2017-09-08 DIAGNOSIS — Z5181 Encounter for therapeutic drug level monitoring: Secondary | ICD-10-CM | POA: Diagnosis not present

## 2017-09-08 DIAGNOSIS — Z0000 Encounter for general adult medical examination without abnormal findings: Secondary | ICD-10-CM | POA: Diagnosis not present

## 2017-09-17 DIAGNOSIS — M7552 Bursitis of left shoulder: Secondary | ICD-10-CM | POA: Diagnosis not present

## 2017-09-17 DIAGNOSIS — Z0000 Encounter for general adult medical examination without abnormal findings: Secondary | ICD-10-CM | POA: Diagnosis not present

## 2017-09-17 DIAGNOSIS — M5136 Other intervertebral disc degeneration, lumbar region: Secondary | ICD-10-CM | POA: Diagnosis not present

## 2017-09-17 DIAGNOSIS — S46002A Unspecified injury of muscle(s) and tendon(s) of the rotator cuff of left shoulder, initial encounter: Secondary | ICD-10-CM | POA: Diagnosis not present

## 2017-12-30 ENCOUNTER — Encounter (HOSPITAL_COMMUNITY): Payer: Self-pay | Admitting: Emergency Medicine

## 2017-12-30 ENCOUNTER — Emergency Department (HOSPITAL_COMMUNITY)
Admission: EM | Admit: 2017-12-30 | Discharge: 2017-12-30 | Disposition: A | Discharge status: Home / Self Care | Payer: 59 | Attending: Emergency Medicine | Admitting: Emergency Medicine

## 2017-12-30 ENCOUNTER — Emergency Department (HOSPITAL_COMMUNITY): Payer: 59

## 2017-12-30 DIAGNOSIS — R252 Cramp and spasm: Secondary | ICD-10-CM | POA: Insufficient documentation

## 2017-12-30 DIAGNOSIS — M79604 Pain in right leg: Secondary | ICD-10-CM | POA: Diagnosis not present

## 2017-12-30 DIAGNOSIS — R072 Precordial pain: Secondary | ICD-10-CM | POA: Insufficient documentation

## 2017-12-30 DIAGNOSIS — R079 Chest pain, unspecified: Secondary | ICD-10-CM | POA: Diagnosis present

## 2017-12-30 DIAGNOSIS — M79605 Pain in left leg: Secondary | ICD-10-CM | POA: Diagnosis not present

## 2017-12-30 DIAGNOSIS — Z79899 Other long term (current) drug therapy: Secondary | ICD-10-CM | POA: Diagnosis not present

## 2017-12-30 LAB — CBC
HCT: 36.6 % (ref 36.0–46.0)
Hemoglobin: 12.4 g/dL (ref 12.0–15.0)
MCH: 30.4 pg (ref 26.0–34.0)
MCHC: 33.9 g/dL (ref 30.0–36.0)
MCV: 89.7 fL (ref 78.0–100.0)
Platelets: 327 10*3/uL (ref 150–400)
RBC: 4.08 MIL/uL (ref 3.87–5.11)
RDW: 13.2 % (ref 11.5–15.5)
WBC: 6 10*3/uL (ref 4.0–10.5)

## 2017-12-30 LAB — I-STAT BETA HCG BLOOD, ED (MC, WL, AP ONLY): I-stat hCG, quantitative: <5 m[IU]/mL (ref <5)

## 2017-12-30 LAB — BASIC METABOLIC PANEL
Anion gap: 8 (ref 5–15)
BUN: 8 mg/dL (ref 6–20)
CO2: 28 mmol/L (ref 22–32)
Calcium: 9.6 mg/dL (ref 8.9–10.3)
Chloride: 107 mmol/L (ref 98–111)
Creatinine, Ser: 0.85 mg/dL (ref 0.44–1.00)
GFR calc Af Amer: >60 mL/min (ref >60)
GFR calc non Af Amer: >60 mL/min (ref >60)
Glucose, Bld: 102 mg/dL — ABNORMAL HIGH (ref 70–99)
Potassium: 4.1 mmol/L (ref 3.5–5.1)
Sodium: 143 mmol/L (ref 135–145)

## 2017-12-30 LAB — I-STAT TROPONIN, ED: Troponin i, poc: 0 ng/mL (ref 0.00–0.08)

## 2017-12-30 MED ORDER — SODIUM CHLORIDE 0.9 % IV BOLUS
1000.0000 mL | Freq: Once | INTRAVENOUS | Status: AC
  Administered 2017-12-30: 1000 mL via INTRAVENOUS

## 2017-12-30 MED ORDER — ALUM & MAG HYDROXIDE-SIMETH 200-200-20 MG/5ML PO SUSP
30.0000 mL | Freq: Once | ORAL | Status: AC
  Administered 2017-12-30: 30 mL via ORAL
  Filled 2017-12-30: qty 30

## 2017-12-30 NOTE — ED Notes (Signed)
Patient transported to X-ray 

## 2017-12-30 NOTE — ED Triage Notes (Signed)
Pt c/o of central chest pain that started around 4am today. Reports yesterday had bilat lower legs cramps and feet cramps and now posterior calf areas are sore.

## 2017-12-30 NOTE — ED Provider Notes (Signed)
[facility]-EMERGENCY DEPT Provider Note   CSN: [id number] Arrival date & time: 12/30/17  0751     History   Chief Complaint Chief Complaint  Patient presents with  . Chest Pain  . Leg Pain    HPI Yvette FRAISER is a 47 y.o. female.  HPI Patient is a healthy 47 year old female with no significant past medical history presents to the emergency department with complaints of constant central chest tightness since 4 AM today.  Is been unchanged.  She has not tried any medication prior to arrival.  There is no radiation of her discomfort.  No shortness of breath or diaphoresis.  No cardiac risk factors.  No family history of early cardiac disease.  She states she had cramping in her bilateral legs and calves yesterday.  The pain in her chest is nonpleuritic.  No history of DVT or pulmonary embolism.  No risk factors for DVT.  Chest discomfort is only mild in severity.   Past Medical History:  Diagnosis Date  . Chronic constipation   . IBS (irritable bowel syndrome)   . Menorrhagia   . OA (osteoarthritis)     Patient Active Problem List   Diagnosis Date Noted  . Menorrhagia 09/17/2015  . GERD (gastroesophageal reflux disease) 10/14/2013  . Other dysphagia 10/14/2013  . Hemorrhoids 09/28/2013  . Bloating 09/28/2013  . Unspecified constipation 09/28/2013    Past Surgical History:  Procedure Laterality Date  . BILATERAL SALPINGECTOMY Bilateral 09/17/2015   Procedure: BILATERAL SALPINGECTOMY;  Surgeon: Richarda Overlie, MD;  Location: [facility];  Service: Gynecology;  Laterality: Bilateral;  . DIAGNOSTIC LAPAROSCOPY  10-12-2000  . HYSTEROSCOPY W/D&C  summer 2016  . LAPAROSCOPIC ASSISTED VAGINAL HYSTERECTOMY N/A 09/17/2015   Procedure: LAPAROSCOPIC ASSISTED VAGINAL HYSTERECTOMY;  Surgeon: Richarda Overlie, MD;  Location: [facility];  Service: Gynecology;  Laterality: N/A;     OB History    Gravida  4   Para  2   Term     Preterm      AB      Living  2     SAB      TAB      Ectopic      Multiple      Live Births               Home Medications    Prior to Admission medications   Medication Sig Start Date End Date Taking? Authorizing Provider  Multiple Vitamin (MULTIVITAMIN WITH MINERALS) TABS tablet Take 1 tablet by mouth daily.   Yes [provider]  naproxen (NAPROSYN) 500 MG tablet Take 1 tablet (500 mg total) by mouth 2 (two) times daily as needed. Patient taking differently: Take 500 mg by mouth 2 (two) times daily as needed (osteoarthritis.).  03/14/17  Yes Petrucelli, Samantha R, PA-C  polyethylene glycol (MIRALAX / GLYCOLAX) packet Take 17 g by mouth daily as needed for mild constipation.   Yes [provider]  ibuprofen (ADVIL,MOTRIN) 800 MG tablet Take 1 tablet (800 mg total) by mouth every 8 (eight) hours as needed for moderate pain (mild pain). Patient not taking: Reported on 12/30/2017 09/22/15   Richarda Overlie, MD  meclizine (ANTIVERT) 25 MG tablet Take 1 tablet (25 mg total) by mouth 3 (three) times daily as needed for dizziness. Patient not taking: Reported on 12/30/2017 03/14/17   Petrucelli, Pleas Koch, PA-C  methocarbamol (ROBAXIN) 500 MG tablet Take 1 tablet (500 mg total) by mouth 2 (two) times  daily as needed for muscle spasms. Patient not taking: Reported on 12/30/2017 03/14/17   Petrucelli, Pleas Koch, PA-C    Family History Family History  Problem Relation Age of Onset  . Diabetes Unknown   . Colon cancer Neg Hx     Social History Social History   Tobacco Use  . Smoking status: Never Smoker  . Smokeless tobacco: Never Used  Substance Use Topics  . Alcohol use: Yes    Comment: OCCASIONAL  . Drug use: No     Allergies   Doxycycline   Review of Systems Review of Systems  All other systems reviewed and are negative.    Physical Exam Updated Vital Signs BP 107/78   Pulse (!) 52   Temp 98.4 F (36.9 C) (Oral)   Resp 12    Ht 5\' 2"  (1.575 m)   Wt 73.9 kg   LMP 08/15/2015 (Approximate)   SpO2 100%   BMI 29.81 kg/m   Physical Exam  Constitutional: She is oriented to person, place, and time. She appears well-developed and well-nourished. No distress.  HENT:  Head: Normocephalic and atraumatic.  Eyes: EOM are normal.  Neck: Normal range of motion.  Cardiovascular: Normal rate, regular rhythm and normal heart sounds.  Pulmonary/Chest: Effort normal and breath sounds normal.  Abdominal: Soft. She exhibits no distension. There is no tenderness.  Musculoskeletal: Normal range of motion.       Right lower leg: Normal. She exhibits no tenderness and no edema.       Left lower leg: Normal. She exhibits no tenderness and no edema.  Normal PT and DP pulses bilaterally  Neurological: She is alert and oriented to person, place, and time.  Skin: Skin is warm and dry.  Psychiatric: She has a normal mood and affect. Judgment normal.  Nursing note and vitals reviewed.    ED Treatments / Results  Labs (all labs ordered are listed, but only abnormal results are displayed) Labs Reviewed  BASIC METABOLIC PANEL - Abnormal; Notable for the following components:      Result Value   Glucose, Bld 102 (*)    All other components within normal limits  CBC  I-STAT TROPONIN, ED  I-STAT BETA HCG BLOOD, ED ([facility], [facility], [facility] ONLY)    EKG EKG Interpretation  Date/Time:  Wednesday December 30 2017 07:58:42 EDT Ventricular Rate:  69 PR Interval:    QRS Duration: 89 QT Interval:  361 QTC Calculation: 387 R Axis:   74 Text Interpretation:  Sinus rhythm Nonspecific T abnormalities, lateral leads No significant change since last tracing Confirmed by Linwood Dibbles [phone]) on 12/30/2017 8:01:45 AM Also confirmed by Linwood Dibbles [phone]), editor Sheppard Evens ([id number])  on 12/30/2017 8:32:25 AM   Radiology Dg Chest 2 View  Result Date: 12/30/2017 CLINICAL DATA:  Chest pain EXAM: CHEST - 2 VIEW COMPARISON:  03/14/2017 FINDINGS: Heart and  mediastinal contours are within normal limits. No focal opacities or effusions. No acute bony abnormality. IMPRESSION: No active cardiopulmonary disease. Electronically Signed   By: Charlett Nose M.D.   On: 12/30/2017 08:21    Procedures Procedures (including critical care time)  Medications Ordered in ED Medications  sodium chloride 0.9 % bolus 1,000 mL (0 mLs Intravenous Stopped 12/30/17 0947)  alum & mag hydroxide-simeth (MAALOX/MYLANTA) 200-200-20 MG/5ML suspension 30 mL (30 mLs Oral Given 12/30/17 0953)     Initial Impression / Assessment and Plan / ED Course  I have reviewed the triage vital signs and the nursing notes.  Pertinent  labs & imaging results that were available during my care of the patient were reviewed by me and considered in my medical decision making (see chart for details).     Overall well-appearing.  Nonspecific chest pain and leg pain.  Doubt DVT and pulmonary embolism.  Doubt ACS.  Doubt dissection.  Overall well-appearing.  Close primary care follow-up.  May be GI related chest pain.  Patient encouraged to return the emergency department for new or worsening symptoms.  Final Clinical Impressions(s) / ED Diagnoses   Final diagnoses:  Precordial chest pain  Bilateral leg cramps    ED Discharge Orders    None       Azalia Bilis, MD 12/30/17 [phone]

## 2018-01-01 DIAGNOSIS — Z23 Encounter for immunization: Secondary | ICD-10-CM | POA: Diagnosis not present

## 2018-04-05 ENCOUNTER — Encounter: Payer: Self-pay | Admitting: Gastroenterology

## 2018-04-13 ENCOUNTER — Ambulatory Visit (INDEPENDENT_AMBULATORY_CARE_PROVIDER_SITE_OTHER): Payer: PRIVATE HEALTH INSURANCE | Admitting: Nurse Practitioner

## 2018-04-13 ENCOUNTER — Encounter: Payer: Self-pay | Admitting: Nurse Practitioner

## 2018-04-13 VITALS — BP 118/80 | HR 71 | Ht 62.0 in | Wt 160.0 lb

## 2018-04-13 DIAGNOSIS — K219 Gastro-esophageal reflux disease without esophagitis: Secondary | ICD-10-CM

## 2018-04-13 DIAGNOSIS — R110 Nausea: Secondary | ICD-10-CM | POA: Diagnosis not present

## 2018-04-13 MED ORDER — RANITIDINE HCL 150 MG PO TABS: 150.0000 mg | ORAL_TABLET | Freq: Every day | ORAL | 5 refills | Status: DC

## 2018-04-13 MED ORDER — OMEPRAZOLE 40 MG PO CPDR: 40.0000 mg | DELAYED_RELEASE_CAPSULE | ORAL | 5 refills | Status: AC

## 2018-04-13 NOTE — Patient Instructions (Signed)
If you are age 48 or older, your body mass index should be between 23-30. Your Body mass index is 29.26 kg/m. If this is out of the aforementioned range listed, please consider follow up with your Primary Care Provider.  If you are age 70 or younger, your body mass index should be between 19-25. Your Body mass index is 29.26 kg/m. If this is out of the aformentioned range listed, please consider follow up with your Primary Care Provider.   You have been scheduled for an endoscopy. Please follow written instructions given to you at your visit today. If you use inhalers (even only as needed), please bring them with you on the day of your procedure. Your physician has requested that you go to www.startemmi.com and enter the access code given to you at your visit today. This web site gives a general overview about your procedure. However, you should still follow specific instructions given to you by our office regarding your preparation for the procedure.  We have sent the following medications to your pharmacy for you to pick up at your convenience: Omeprazole Zantac  Thank you for choosing me and [facility].   Willette Cluster, NP

## 2018-04-13 NOTE — Progress Notes (Signed)
I agree with the above note, plan 

## 2018-04-13 NOTE — Progress Notes (Signed)
ASSESSMENT / PLAN:   48 year old female with nearly 7346-month history of intermittent nausea, throat discomfort ("pins") / dry cough, especially at night. Symptoms not improved with Omeprazole 20 mg daily no after doubling the dose.  -anti-reflux measures discussed. If cannot elevated HOB then try wedge pillow -continue Omeprazole 40 mg q am 30 minutes prior to breakfast.  -for now will add H2 blocker at bedtime  -Not sure about the nausea but seems like her the throat discomfort and dry cough are GERD related and so though not sure why she hasn't had any improvement with PPI. For further evaluation patient will be scheduled for EGD. The risks and benefits of EGD were discussed and the patient agrees to proceed.    HPI:    Chief Complaint:   Nausea, burning in chest   Patient is a 48 year old female known to Dr. Christella HartiganJacobs.  She has been  followed here in the past for thrombosed hemorrhoids, GERD, and dysphagia but not seen in ~ 5 years  Laurelyn SickleKatina comes in today with complaints of discomfort in her throat/chest as well as nausea.  She has a hx of GERD takes Omeprazole on an as needed basis. Sometime around October or November patient began to feel like she had "pins" in her throat.  She then developed sensation of something in the right side of her throat triggering a non-productive cough. She also coughs a lot at night. Doesn't actually regurgitate  but feels like acid comes up into her throat. No sinus drainage. She started taking Omeprazole every day, even doubled the dose in November without any improvement in symptoms.  She is not having any problems swallowing. No odynophagia.  She does complain of intermittent nausea (no chance of pregnancy s/p hysterectomy). Her weight is stable. Naprosyn is on home med list, she takes it for arthritis but none in 3 to 4 months. No lower GI complaints.    Past Medical History:  Diagnosis Date  . Chronic constipation   . IBS (irritable bowel  syndrome)   . Menorrhagia   . OA (osteoarthritis)     Past Surgical History:  Procedure Laterality Date  . BILATERAL SALPINGECTOMY Bilateral 09/17/2015   Procedure: BILATERAL SALPINGECTOMY;  Surgeon: Richarda Overlieichard Holland, MD;  [facility] [place];  Service: Gynecology;  Laterality: Bilateral;  . DIAGNOSTIC LAPAROSCOPY  10-12-2000  . HYSTEROSCOPY W/D&C  summer 2016  . LAPAROSCOPIC ASSISTED VAGINAL HYSTERECTOMY N/A 09/17/2015   Procedure: LAPAROSCOPIC ASSISTED VAGINAL HYSTERECTOMY;  Surgeon: Richarda Overlieichard Holland, MD;  [facility] [place];  Service: Gynecology;  Laterality: N/A;   Family History  Problem Relation Age of Onset  . Diabetes Maternal Aunt   . Cirrhosis Maternal Uncle   . Colon cancer Neg Hx   . Pancreatic cancer Neg Hx    Social History   Tobacco Use  . Smoking status: Never Smoker  . Smokeless tobacco: Never Used  Substance Use Topics  . Alcohol use: Yes    Comment: OCCASIONAL  . Drug use: No   Current Outpatient Medications  Medication Sig Dispense Refill  . ibuprofen (ADVIL,MOTRIN) 800 MG tablet Take 1 tablet (800 mg total) by mouth every 8 (eight) hours as needed for moderate pain (mild pain). 30 tablet 1  . Multiple Vitamin (MULTIVITAMIN WITH MINERALS) TABS tablet Take 1 tablet by mouth daily.    . naproxen (NAPROSYN) 500 MG tablet Take 1 tablet (500 mg total) by  mouth 2 (two) times daily as needed. (Patient taking differently: Take 500 mg by mouth 2 (two) times daily as needed (osteoarthritis.). ) 30 tablet 0  . Omega-3 Fatty Acids (FISH OIL PO) Take by mouth.    Marland Kitchen. omeprazole (PRILOSEC) 20 MG capsule Take 20 mg by mouth daily.    . polyethylene glycol (MIRALAX / GLYCOLAX) packet Take 17 g by mouth daily as needed for mild constipation.    . TURMERIC PO Take by mouth.     No current facility-administered medications for this visit.    Allergies  Allergen Reactions  . Doxycycline Rash     Review of Systems: All systems reviewed and  negative except where noted in HPI.   Creatinine clearance cannot be calculated (Patient's most recent lab result is older than the maximum 21 days allowed.)   Physical Exam:    Wt Readings from Last 3 Encounters:  04/13/18 160 lb (72.6 kg)  12/30/17 163 lb (73.9 kg)  03/14/17 153 lb (69.4 kg)    BP 118/80   Pulse 71   Ht 5\' 2"  (1.575 m)   Wt 160 lb (72.6 kg)   LMP 08/15/2015 (Approximate)   BMI 29.26 kg/m  Constitutional:  Pleasant female in no acute distress. Psychiatric: Normal mood and affect. Behavior is normal. EENT: Pupils normal.  Conjunctivae are normal. No scleral icterus. Neck supple, no masses felt.  Cardiovascular: Normal rate, regular rhythm. No edema Pulmonary/chest: Effort normal and breath sounds normal. No wheezing, rales or rhonchi. Abdominal: Soft, nondistended, nontender. Bowel sounds active throughout. There are no masses palpable. No hepatomegaly. Neurological: Alert and oriented to person place and time. Skin: Skin is warm and dry. No rashes noted.  Willette ClusterPaula Guenther, NP  04/13/2018, 10:25 AM

## 2018-04-26 ENCOUNTER — Ambulatory Visit (AMBULATORY_SURGERY_CENTER): Payer: PRIVATE HEALTH INSURANCE | Admitting: Gastroenterology

## 2018-04-26 ENCOUNTER — Encounter: Payer: Self-pay | Admitting: Gastroenterology

## 2018-04-26 VITALS — BP 106/65 | HR 62 | Temp 98.0°F | Resp 16 | Ht 62.0 in | Wt 160.0 lb

## 2018-04-26 DIAGNOSIS — K2990 Gastroduodenitis, unspecified, without bleeding: Secondary | ICD-10-CM

## 2018-04-26 DIAGNOSIS — R110 Nausea: Secondary | ICD-10-CM

## 2018-04-26 DIAGNOSIS — K2950 Unspecified chronic gastritis without bleeding: Secondary | ICD-10-CM

## 2018-04-26 DIAGNOSIS — K2970 Gastritis, unspecified, without bleeding: Secondary | ICD-10-CM | POA: Diagnosis present

## 2018-04-26 MED ORDER — SODIUM CHLORIDE 0.9 % IV SOLN: 500.0000 mL | Freq: Once | INTRAVENOUS | Status: DC

## 2018-04-26 NOTE — Patient Instructions (Signed)
Impression/Recommendations:  Gastritis handout given to patient.  Resume previous diet. Stop Ranitidine and instead take Pepcid (Famotidine) 20 mg. Pills, one tablet at bedtime nightly. Continue Omeprazole every every morning shortly before breakfast.  Await pathology results.  YOU HAD AN ENDOSCOPIC PROCEDURE TODAY AT THE [facility]:   Refer to the procedure report that was given to you for any specific questions about what was found during the examination.  If the procedure report does not answer your questions, please call your gastroenterologist to clarify.  If you requested that your care partner not be given the details of your procedure findings, then the procedure report has been included in a sealed envelope for you to review at your convenience later.  YOU SHOULD EXPECT: Some feelings of bloating in the abdomen. Passage of more gas than usual.  Walking can help get rid of the air that was put into your GI tract during the procedure and reduce the bloating. If you had a lower endoscopy (such as a colonoscopy or flexible sigmoidoscopy) you may notice spotting of blood in your stool or on the toilet paper. If you underwent a bowel prep for your procedure, you may not have a normal bowel movement for a few days.  Please Note:  You might notice some irritation and congestion in your nose or some drainage.  This is from the oxygen used during your procedure.  There is no need for concern and it should clear up in a day or so.  SYMPTOMS TO REPORT IMMEDIATELY:  Following upper endoscopy (EGD)  Vomiting of blood or coffee ground material  New chest pain or pain under the shoulder blades  Painful or persistently difficult swallowing  New shortness of breath  Fever of 100F or higher  Black, tarry-looking stools  For urgent or emergent issues, a gastroenterologist can be reached at any hour by calling [phone].   DIET:  We do recommend a small meal at first, but then  you may proceed to your regular diet.  Drink plenty of fluids but you should avoid alcoholic beverages for 24 hours.  ACTIVITY:  You should plan to take it easy for the rest of today and you should NOT DRIVE or use heavy machinery until tomorrow (because of the sedation medicines used during the test).    FOLLOW UP: Our staff will call the number listed on your records the next business day following your procedure to check on you and address any questions or concerns that you may have regarding the information given to you following your procedure. If we do not reach you, we will leave a message.  However, if you are feeling well and you are not experiencing any problems, there is no need to return our call.  We will assume that you have returned to your regular daily activities without incident.  If any biopsies were taken you will be contacted by phone or by letter within the next 1-3 weeks.  Please call us at [phone] if you have not heard about the biopsies in 3 weeks.    SIGNATURES/CONFIDENTIALITY: You and/or your care partner have signed paperwork which will be entered into your electronic medical record.  These signatures attest to the fact that that the information above on your After Visit Summary has been reviewed and is understood.  Full responsibility of the confidentiality of this discharge information lies with you and/or your care-partner.

## 2018-04-26 NOTE — Progress Notes (Signed)
I have reviewed the patient's medical history in detail and updated the computerized patient record.

## 2018-04-26 NOTE — Progress Notes (Signed)
Called to room to assist during endoscopic procedure.  Patient ID and intended procedure confirmed with present staff. Received instructions for my participation in the procedure from the performing physician.  

## 2018-04-26 NOTE — Op Note (Signed)
[facility] Patient Name: Yvette ServiceKatina [name] Procedure Date: 04/26/2018 3:23 PM MRN: [id number] Endoscopist: Rachael Feeaniel P Stephie Xu , MD Age: 48 Referring MD:  Date of Birth: 01/14/1971 Gender: Female Account #: [id number] Procedure:                Upper GI endoscopy Indications:              Nausea, sore throat (on right side usually),                            coughing when laying down Medicines:                Monitored Anesthesia Care Procedure:                Pre-Anesthesia Assessment:                           - Prior to the procedure, a History and Physical                            was performed, and patient medications and                            allergies were reviewed. The patient's tolerance of                            previous anesthesia was also reviewed. The risks                            and benefits of the procedure and the sedation                            options and risks were discussed with the patient.                            All questions were answered, and informed consent                            was obtained. Prior Anticoagulants: The patient has                            taken no previous anticoagulant or antiplatelet                            agents. ASA Grade Assessment: II - A patient with                            mild systemic disease. After reviewing the risks                            and benefits, the patient was deemed in                            satisfactory condition to undergo the procedure.  After obtaining informed consent, the endoscope was                            passed under direct vision. Throughout the                            procedure, the patient's blood pressure, pulse, and                            oxygen saturations were monitored continuously. The                            Endoscope was introduced through the mouth, and                            advanced to the second part of  duodenum. The upper                            GI endoscopy was accomplished without difficulty.                            The patient tolerated the procedure well. Scope In: Scope Out: Findings:                 Minimal inflammation characterized by erythema was                            found in the gastric antrum. Biopsies were taken                            with a cold forceps for histology.                           The exam was otherwise without abnormality. Complications:            No immediate complications. Estimated blood loss:                            None. Estimated Blood Loss:     Estimated blood loss: none. Impression:               - Mild gastritis. Biopsied.                           - The examination was otherwise normal. Recommendation:           - Patient has a contact number available for                            emergencies. The signs and symptoms of potential                            delayed complications were discussed with the                            patient. Return to normal activities tomorrow.  Written discharge instructions were provided to the                            patient.                           - Resume previous diet.                           - Stop ranitidine and instead take pepcid                            (famotidine) 20mg  pills, one pill at bedtime                            nightly. Continue omeprazole every morning shortly                            before breakfast.                           - Await pathology results. Rachael Feeaniel P Shatira Dobosz, MD 04/26/2018 3:34:45 PM This report has been signed electronically.

## 2018-04-26 NOTE — Progress Notes (Signed)
Report to PACU, RN, vss, BBS= Clear.  

## 2018-04-27 ENCOUNTER — Encounter: Payer: Self-pay | Admitting: *Deleted

## 2018-04-27 ENCOUNTER — Telehealth: Payer: Self-pay | Admitting: *Deleted

## 2018-04-27 NOTE — Telephone Encounter (Signed)
Opened window in error. 

## 2018-04-27 NOTE — Telephone Encounter (Signed)
First follow up call attempt.  Reached voicemail.  Message left to call if any questions or concerns. 

## 2018-05-05 ENCOUNTER — Encounter: Payer: Self-pay | Admitting: Gastroenterology

## 2018-05-05 ENCOUNTER — Ambulatory Visit: Payer: No Typology Code available for payment source | Admitting: Gastroenterology

## 2018-05-26 ENCOUNTER — Telehealth: Payer: Self-pay | Admitting: Gastroenterology

## 2018-05-26 NOTE — Telephone Encounter (Signed)
The pt was advised that referrals would need to come from her PCP.  She has been seen by Dr Christella Hartigan but is unsure that her cough is reflux related.

## 2018-05-26 NOTE — Telephone Encounter (Signed)
Pt is asking where Dr. Christella Hartigan was going to refer her for coughing, if an ENT or pulmonary?

## 2018-06-24 ENCOUNTER — Encounter: Payer: Self-pay | Admitting: Internal Medicine

## 2018-06-24 ENCOUNTER — Ambulatory Visit (INDEPENDENT_AMBULATORY_CARE_PROVIDER_SITE_OTHER): Payer: BLUE CROSS/BLUE SHIELD | Admitting: Internal Medicine

## 2018-06-24 ENCOUNTER — Other Ambulatory Visit: Payer: Self-pay

## 2018-06-24 VITALS — BP 106/70 | HR 65 | Temp 98.4°F | Ht 62.0 in | Wt 163.8 lb

## 2018-06-24 DIAGNOSIS — R05 Cough: Secondary | ICD-10-CM

## 2018-06-24 DIAGNOSIS — R058 Other specified cough: Secondary | ICD-10-CM

## 2018-06-24 MED ORDER — PREDNISONE 10 MG PO TABS: ORAL_TABLET | ORAL | 0 refills | Status: DC

## 2018-06-24 MED ORDER — GABAPENTIN 100 MG PO CAPS: 100.0000 mg | ORAL_CAPSULE | Freq: Three times a day (TID) | ORAL | 2 refills | Status: DC

## 2018-06-24 NOTE — Assessment & Plan Note (Addendum)
Onset Nov 2019  Neg resp to gerd or pnds rx  - rx gabapentin 100 tid 06/24/2018  - Allergy profile 06/24/2018 >  Eos 0. /  IgE  Pending    Upper airway cough syndrome (previously labeled PNDS),  is so named because it's frequently impossible to sort out how much is  CR/sinusitis with freq throat clearing (which can be related to primary GERD)   vs  causing  secondary (" extra esophageal")  GERD from wide swings in gastric pressure that occur with throat clearing, often  promoting self use of mint and menthol lozenges that reduce the lower esophageal sphincter tone and exacerbate the problem further in a cyclical fashion.   These are the same pts (now being labeled as having "irritable larynx syndrome" by some cough centers) who not infrequently have a history of having failed to tolerate ace inhibitors,  dry powder inhalers or biphosphonates or report having atypical/extraesophageal reflux symptoms that don't respond to standard doses of PPI  and are easily confused as having aecopd or asthma flares by even experienced allergists/ pulmonologists (myself included).   Of the three most common causes of  Sub-acute / recurrent or chronic cough, only one (GERD)  can actually contribute to/ trigger  the other two (asthma and post nasal drip syndrome)  and perpetuate the cylce of cough.  While not intuitively obvious, many patients with chronic low grade reflux do not cough until there is a primary insult that disturbs the protective epithelial barrier and exposes sensitive nerve endings which causes cough in a cyclical manner as coughing then causes worse reflux.  She is clearly having non-acid reflux sensations x years when tries to lie flat and now can't stop coughing which is likely cyclical. To stop this Best option is gabapentin 100 mg tid if can  tolerate and 1st gen H1 blockers per guidelines while continuing rx for gerd max acid suppression/ diet and short course pred pending allergy eval  pending.    Discussed in detail all the  indications, usual  risks and alternatives  relative to the benefits with patient who agrees to proceed with rx as outlined x one month  Advised:  The standardized cough guidelines published in Chest by [place] in 2006 are still the best available and consist of a multiple step process (up to 12!) , not a single office visit,  and are intended  to address this problem logically,  with an alogrithm dependent on response to empiric treatment at  each progressive step  to determine a specific diagnosis with  minimal addtional testing needed. Therefore if adherence is an issue or can't be accurately verified,  it's very unlikely the standard evaluation and treatment will be successful here.    Furthermore, response to therapy (other than acute cough suppression, which should only be used short term with avoidance of narcotic containing cough syrups if possible), can be a gradual process for which the patient is not likely to  perceive immediate benefit.  Unlike going to an eye doctor where the best perscription is almost always the first one and is immediately effective, this is almost never the case in the management of chronic cough syndromes. Therefore the patient needs to commit up front to consistently adhere to recommendations  for up to 6 weeks of therapy directed at the likely underlying problem(s) before the response can be reasonably evaluated.   Will do televisit in one month if unless  corona restrictions have been lifted by then.  Total time devoted to counseling  > 50 % of initial 60 min office visit:  review case with pt/ discussion of options/alternatives/ personally creating written customized instructions  in presence of pt  then going over those specific  Instructions directly with the pt including how to use all of the meds (using graphic and text formatted info)  but in particular covering each new medication in detail and the  difference between the maintenance= "automatic" meds and the prns using an action plan format for the latter (If this problem/symptom => do that organization reading Left to right).  Please see AVS from this visit for a full list of these instructions which I personally wrote for this pt and  are unique to this visit.

## 2018-06-24 NOTE — Progress Notes (Signed)
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  no resp issues as child and onset reflux symptoms esp supine p pregnancy 1999 on intermittent ppi since with worse symptoms of HB with fried foods but no need a maint rx but indolent onset Nov 2019 tickle in throat with coughing fits so changed to daily ppi at mother's advice and didn't help > Dr Christella Hartigan 04/26/2018 with nl EGD and rec omeprazole 40 mg qam ac and pepcid 20 mg hs but no better so Dr Linward Natal rec flonase/ clariton vs zytec > no better so referred to pulmonary clinic 06/24/2018 by Dr   Kelvin Cellar      History of Present Illness  06/24/2018  Pulmonary/ 1st office eval/Yvette [name]  Chief Complaint  Patient presents with  . Pulmonary Consult    Self referral. Pt c/o cough since Nov 2019.  She states cough starts as a "tickle" in her throat and she feels like there is something there when she swallows.   Dyspnea:  Not limited by breathing from desired activities   Cough: never expectorates/ sensation of globus Sleep: worse when lying 45 degrees x years esp fried or spicy food  SABA use: none  Assoc with urinary incont  Never prednisone     Kouffman Reflux v Neurogenic Cough Differentiator Reflux Comments  Do you awaken from a sound sleep coughing violently?                            With trouble breathing? Yes no   Do you have choking episodes when you cannot  Get enough air, gasping for air ?              no   Do you usually cough when you lie down into  The bed, or when you just lie down to rest ?                          Delayed sev hours    Do you usually cough after meals or eating?         no   Do you cough when (or after) you bend over?    no   GERD SCORE     Kouffman Reflux v Neurogenic Cough Differentiator Neurogenic   Do you more-or-less cough all day long? sporadic   Does change of temperature make you cough? No    Does laughing or  chuckling cause you to cough? No   Do fumes (perfume, automobile fumes, burned  Toast, etc.,) cause you to cough ?      Flowers, cigs   Does speaking, singing, or talking on the phone cause you to cough   ?               no   Neurogenic/Airway score        No other obvious day to day or daytime variability or assoc excess/ purulent sputum or mucus plugs or hemoptysis or cp or chest tightness, subjective wheeze or overt sinus or hb symptoms.     Also denies any obvious fluctuation of symptoms with weather or environmental changes or other aggravating or alleviating factors except as outlined above   No unusual exposure hx or h/o childhood pna/ asthma or knowledge of premature birth.  Current Allergies, Complete Past Medical  History, Past Surgical History, Family History, and Social History were reviewed in [employer] record.  ROS  The following are not active complaints unless bolded Hoarseness, sore throat, dysphagia= globus sensation, dental problems, itching, sneezing,  nasal congestion or discharge of excess mucus or purulent secretions, ear ache,   fever, chills, sweats, unintended wt loss or wt gain, classically pleuritic or exertional cp,  orthopnea pnd or arm/hand swelling  or leg swelling, presyncope, palpitations, abdominal pain, anorexia, nausea, vomiting, diarrhea  or change in bowel habits or change in bladder habits, change in stools or change in urine, dysuria, hematuria,  rash, arthralgias, visual complaints, headache, numbness, weakness or ataxia or problems with walking or coordination,  change in mood or  memory.           Past Medical History:  Diagnosis Date  . Chronic constipation   . IBS (irritable bowel syndrome)   . Menorrhagia   . OA (osteoarthritis)     Outpatient Medications Prior to Visit  Medication Sig Dispense Refill  . cetirizine (ZYRTEC) 10 MG tablet Take 10 mg by mouth daily.    . clonazePAM (KLONOPIN) 0.5 MG tablet TAKE 1  TABLET BY MOUTH TWICE A DAY **MUST LAST 30 DAYS**    . dextromethorphan-guaiFENesin (MUCINEX DM) 30-600 MG 12hr tablet Take 1 tablet by mouth daily.    . fluticasone (FLONASE) 50 MCG/ACT nasal spray Place 2 sprays into both nostrils daily.    Marland Kitchen ibuprofen (ADVIL,MOTRIN) 800 MG tablet Take 1 tablet (800 mg total) by mouth every 8 (eight) hours as needed for moderate pain (mild pain). 30 tablet 1  . loratadine (CLARITIN) 10 MG tablet Take 10 mg by mouth daily.    . Multiple Vitamin (MULTIVITAMIN WITH MINERALS) TABS tablet Take 1 tablet by mouth daily.    . Omega-3 Fatty Acids (FISH OIL PO) Take by mouth.    Marland Kitchen omeprazole (PRILOSEC) 40 MG capsule Take 1 capsule (40 mg total) by mouth every morning. Take 30-60 minutes before breakfast 30 capsule 5  . polyethylene glycol (MIRALAX / GLYCOLAX) packet Take 17 g by mouth daily as needed for mild constipation.    . ranitidine (ZANTAC) 150 MG tablet Take 1 tablet (150 mg total) by mouth at bedtime. 30 tablet 5  . TURMERIC PO Take by mouth.    . naproxen (NAPROSYN) 500 MG tablet Take 1 tablet (500 mg total) by mouth 2 (two) times daily as needed. (Patient taking differently: Take 500 mg by mouth 2 (two) times daily as needed (osteoarthritis.). ) 30 tablet 0      Objective:     BP 106/70 (BP Location: Left Arm, Cuff Size: Normal)   Pulse 65   Temp 98.4 F (36.9 C) (Oral)   Ht 5\' 2"  (1.575 m)   Wt 163 lb 12.8 oz (74.3 kg)   LMP 08/15/2015 (Approximate)   SpO2 99%   BMI 29.96 kg/m       amb bf nad    HEENT: nl dentition, turbinates bilaterally, and oropharynx. Nl external ear canals without cough reflex   NECK :  without JVD/Nodes/TM/ nl carotid upstrokes bilaterally   LUNGS: no acc muscle use,  Nl contour chest which is clear to A and P bilaterally without cough on insp or exp maneuvers   CV:  RRR  no s3 or murmur or increase in P2, and no edema   ABD:  soft and nontender with nl inspiratory excursion in the supine position. No bruits or  organomegaly appreciated,  bowel sounds nl  MS:  Nl gait/ ext warm without deformities, calf tenderness, cyanosis or clubbing No obvious joint restrictions   SKIN: warm and dry without lesions    NEURO:  alert, approp, nl sensorium with  no motor or cerebellar deficits apparent.    Labs ordered 06/24/2018  Allergy profile   cxr per PCP w/in a month clear per pet     Assessment   Upper airway cough syndrome Onset Nov 2019  Neg resp to gerd or pnds rx  - rx gabapentin 100 tid 06/24/2018  - Allergy profile 06/24/2018 >  Eos 0. /  IgE  Pending    Upper airway cough syndrome (previously labeled PNDS),  is so named because it's frequently impossible to sort out how much is  CR/sinusitis with freq throat clearing (which can be related to primary GERD)   vs  causing  secondary (" extra esophageal")  GERD from wide swings in gastric pressure that occur with throat clearing, often  promoting self use of mint and menthol lozenges that reduce the lower esophageal sphincter tone and exacerbate the problem further in a cyclical fashion.   These are the same pts (now being labeled as having "irritable larynx syndrome" by some cough centers) who not infrequently have a history of having failed to tolerate ace inhibitors,  dry powder inhalers or biphosphonates or report having atypical/extraesophageal reflux symptoms that don't respond to standard doses of PPI  and are easily confused as having aecopd or asthma flares by even experienced allergists/ pulmonologists (myself included).   Of the three most common causes of  Sub-acute / recurrent or chronic cough, only one (GERD)  can actually contribute to/ trigger  the other two (asthma and post nasal drip syndrome)  and perpetuate the cylce of cough.  While not intuitively obvious, many patients with chronic low grade reflux do not cough until there is a primary insult that disturbs the protective epithelial barrier and exposes sensitive nerve endings which  causes cough in a cyclical manner as coughing then causes worse reflux.  She is clearly having non-acid reflux sensations x years when tries to lie flat and now can't stop coughing which is likely cyclical. To stop this Best option is gabapentin 100 mg tid if can  tolerate and 1st gen H1 blockers per guidelines while continuing rx for gerd max acid suppression/ diet and short course pred pending allergy eval pending.    Discussed in detail all the  indications, usual  risks and alternatives  relative to the benefits with patient who agrees to proceed with rx as outlined x one month  Advised:  The standardized cough guidelines published in [facility] by [facility] [place] in 2006 are still the best available and consist of a multiple step process (up to 12!) , not a single office visit,  and are intended  to address this problem logically,  with an alogrithm dependent on response to empiric treatment at  each progressive step  to determine a specific diagnosis with  minimal addtional testing needed. Therefore if adherence is an issue or can't be accurately verified,  it's very unlikely the standard evaluation and treatment will be successful here.    Furthermore, response to therapy (other than acute cough suppression, which should only be used short term with avoidance of narcotic containing cough syrups if possible), can be a gradual process for which the patient is not likely to  perceive immediate benefit.  Unlike going to an eye doctor where the best  perscription is almost always the first one and is immediately effective, this is almost never the case in the management of chronic cough syndromes. Therefore the patient needs to commit up front to consistently adhere to recommendations  for up to 6 weeks of therapy directed at the likely underlying problem(s) before the response can be reasonably evaluated.   Will do televisit in one month if unless  corona restrictions have been lifted by then.    Total  time devoted to counseling  > 50 % of initial 60 min office visit:  review case with pt/ discussion of options/alternatives/ personally creating written customized instructions  in presence of pt  then going over those specific  Instructions directly with the pt including how to use all of the meds (using graphic and text formatted info)  but in particular covering each new medication in detail and the difference between the maintenance= "automatic" meds and the prns using an action plan format for the latter (If this problem/symptom => do that organization reading Left to right).  Please see AVS from this visit for a full list of these instructions which I personally wrote for this pt and  are unique to this visit.            Sandrea Hughs, MD 06/24/2018

## 2018-06-24 NOTE — Patient Instructions (Signed)
The key to effective treatment for your cough is eliminating the non-stop cycle of cough you're stuck in long enough to let your airway heal completely and then see if there is anything still making you cough once you stop the cough suppression, but this should take no more than 5 days to figure out  Gabapentin 100 mg three times daily    Prednisone 10 mg take  4 each am x 2 days,   2 each am x 2 days,  1 each am x 2 days and stop (this is to eliminate allergies and inflammation from coughing)  Omeprazole 40 mg Take 30-60 min before first meal of the day and Pepcid 20 mg(or Zantac 150)  one bedtime plus chlorpheniramine 4 mg (chlortabs at [facility])  x 1- 2 at bedtime (both available over the counter)  until cough is completely gone for at least a week without the need for cough suppression  GERD (REFLUX)  is an extremely common cause of respiratory symptoms, many times with no significant heartburn at all.    It can be treated with medication, but also with lifestyle changes including avoidance of late meals, excessive alcohol, smoking cessation, and avoid fatty foods, chocolate, peppermint, colas, red wine, and acidic juices such as orange juice.  NO MINT OR MENTHOL PRODUCTS SO NO COUGH DROPS   USE HARD CANDY INSTEAD (jolley ranchers or Stover's or Lifesavers (all available in sugarless versions) NO OIL BASED VITAMINS - use powdered substitutes.   Please remember to go to the lab department   for your tests - we will call you with the results when they are available.      Call me back in 4 weeks to take the next step -  Upper airway cough syndrome - [place]

## 2018-06-25 ENCOUNTER — Telehealth: Payer: Self-pay | Admitting: Internal Medicine

## 2018-06-25 LAB — CBC WITH DIFFERENTIAL/PLATELET
Absolute Monocytes: 580 cells/uL (ref 200–950)
Basophils Absolute: 43 cells/uL (ref 0–200)
Basophils Relative: 0.7 %
Eosinophils Absolute: 31 cells/uL (ref 15–500)
Eosinophils Relative: 0.5 %
HCT: 35.4 % (ref 35.0–45.0)
Hemoglobin: 12 g/dL (ref 11.7–15.5)
Lymphs Abs: 2025 cells/uL (ref 850–3900)
MCH: 29.5 pg (ref 27.0–33.0)
MCHC: 33.9 g/dL (ref 32.0–36.0)
MCV: 87 fL (ref 80.0–100.0)
MPV: 10.2 fL (ref 7.5–12.5)
Monocytes Relative: 9.5 %
Neutro Abs: 3422 cells/uL (ref 1500–7800)
Neutrophils Relative %: 56.1 %
Platelets: 325 10*3/uL (ref 140–400)
RBC: 4.07 10*6/uL (ref 3.80–5.10)
RDW: 12.8 % (ref 11.0–15.0)
Total Lymphocyte: 33.2 %
WBC: 6.1 10*3/uL (ref 3.8–10.8)

## 2018-06-25 LAB — RESPIRATORY ALLERGY PROFILE REGION II ~~LOC~~

## 2018-06-25 LAB — INTERPRETATION:

## 2018-06-25 NOTE — Progress Notes (Signed)
LMTCB

## 2018-06-25 NOTE — Telephone Encounter (Signed)
Spoke with pt and informed of lab results per Dr Sherene Sires.  Pt verbalized understanding.  Nothing further needed.

## 2018-07-30 ENCOUNTER — Emergency Department (HOSPITAL_COMMUNITY): Admission: EM | Admit: 2018-07-30 | Discharge: 2018-07-30 | Discharge status: Absent without leave | Payer: Self-pay

## 2018-07-30 ENCOUNTER — Other Ambulatory Visit: Payer: Self-pay

## 2018-07-30 ENCOUNTER — Encounter (HOSPITAL_COMMUNITY): Payer: Self-pay | Admitting: Emergency Medicine

## 2018-07-30 ENCOUNTER — Emergency Department (HOSPITAL_COMMUNITY)
Admission: EM | Admit: 2018-07-30 | Discharge: 2018-07-30 | Disposition: A | Discharge status: Home / Self Care | Payer: HRSA Program | Attending: Emergency Medicine | Admitting: Emergency Medicine

## 2018-07-30 DIAGNOSIS — Z79899 Other long term (current) drug therapy: Secondary | ICD-10-CM | POA: Diagnosis not present

## 2018-07-30 DIAGNOSIS — M791 Myalgia, unspecified site: Secondary | ICD-10-CM | POA: Diagnosis present

## 2018-07-30 DIAGNOSIS — Z20828 Contact with and (suspected) exposure to other viral communicable diseases: Secondary | ICD-10-CM | POA: Insufficient documentation

## 2018-07-30 DIAGNOSIS — Z20822 Contact with and (suspected) exposure to covid-19: Secondary | ICD-10-CM

## 2018-07-30 DIAGNOSIS — R6889 Other general symptoms and signs: Secondary | ICD-10-CM

## 2018-07-30 NOTE — Discharge Instructions (Signed)
If you develop trouble breathing, vomiting, or any other new/concerning symptoms then return to the ER for evaluation.  Otherwise you need to quarantine yourself at home and await your test results.     Person Under Monitoring Name: Yvette [name]  Location: [address] [place] [address]   Infection Prevention Recommendations for Individuals Confirmed to have, or Being Evaluated for, 2019 Novel Coronavirus (COVID-19) Infection Who Receive Care at Home  Individuals who are confirmed to have, or are being evaluated for, COVID-19 should follow the prevention steps below until a healthcare provider or local or state health department says they can return to normal activities.  Stay home except to get medical care You should restrict activities outside your home, except for getting medical care. Do not go to work, school, or public areas, and do not use public transportation or taxis.  Call ahead before visiting your doctor Before your medical appointment, call the healthcare provider and tell them that you have, or are being evaluated for, COVID-19 infection. This will help the healthcare providers office take steps to keep other people from getting infected. Ask your healthcare provider to call the local or state health department.  Monitor your symptoms Seek prompt medical attention if your illness is worsening (e.g., difficulty breathing). Before going to your medical appointment, call the healthcare provider and tell them that you have, or are being evaluated for, COVID-19 infection. Ask your healthcare provider to call the local or state health department.  Wear a facemask You should wear a facemask that covers your nose and mouth when you are in the same room with other people and when you visit a healthcare provider. People who live with or visit you should also wear a facemask while they are in the same room with you.  Separate yourself from other people in  your home As much as possible, you should stay in a different room from other people in your home. Also, you should use a separate bathroom, if available.  Avoid sharing household items You should not share dishes, drinking glasses, cups, eating utensils, towels, bedding, or other items with other people in your home. After using these items, you should wash them thoroughly with soap and water.  Cover your coughs and sneezes Cover your mouth and nose with a tissue when you cough or sneeze, or you can cough or sneeze into your sleeve. Throw used tissues in a lined trash can, and immediately wash your hands with soap and water for at least 20 seconds or use an alcohol-based hand rub.  Wash your [employer] your hands often and thoroughly with soap and water for at least 20 seconds. You can use an alcohol-based hand sanitizer if soap and water are not available and if your hands are not visibly dirty. Avoid touching your eyes, nose, and mouth with unwashed hands.   Prevention Steps for Caregivers and Household Members of Individuals Confirmed to have, or Being Evaluated for, COVID-19 Infection Being Cared for in the Home  If you live with, or provide care at home for, a person confirmed to have, or being evaluated for, COVID-19 infection please follow these guidelines to prevent infection:  Follow healthcare providers instructions Make sure that you understand and can help the patient follow any healthcare provider instructions for all care.  Provide for the patients basic needs You should help the patient with basic needs in the home and provide support for getting groceries, prescriptions, and other personal needs.  Monitor the patients symptoms If they are getting sicker, call his or her medical provider and tell them that the patient has, or is being evaluated for, COVID-19 infection. This will help the healthcare providers office take steps to keep other people from getting  infected. Ask the healthcare provider to call the local or state health department.  Limit the number of people who have contact with the patient If possible, have only one caregiver for the patient. Other household members should stay in another home or place of residence. If this is not possible, they should stay in another room, or be separated from the patient as much as possible. Use a separate bathroom, if available. Restrict visitors who do not have an essential need to be in the home.  Keep older adults, very young children, and other sick people away from the patient Keep older adults, very young children, and those who have compromised immune systems or chronic health conditions away from the patient. This includes people with chronic heart, lung, or kidney conditions, diabetes, and cancer.  Ensure good ventilation Make sure that shared spaces in the home have good air flow, such as from an air conditioner or an opened window, weather permitting.  Wash your hands often Wash your hands often and thoroughly with soap and water for at least 20 seconds. You can use an alcohol based hand sanitizer if soap and water are not available and if your hands are not visibly dirty. Avoid touching your eyes, nose, and mouth with unwashed hands. Use disposable paper towels to dry your hands. If not available, use dedicated cloth towels and replace them when they become wet.  Wear a facemask and gloves Wear a disposable facemask at all times in the room and gloves when you touch or have contact with the patients blood, body fluids, and/or secretions or excretions, such as sweat, saliva, sputum, nasal mucus, vomit, urine, or feces.  Ensure the mask fits over your nose and mouth tightly, and do not touch it during use. Throw out disposable facemasks and gloves after using them. Do not reuse. Wash your hands immediately after removing your facemask and gloves. If your personal clothing becomes  contaminated, carefully remove clothing and launder. Wash your hands after handling contaminated clothing. Place all used disposable facemasks, gloves, and other waste in a lined container before disposing them with other household waste. Remove gloves and wash your hands immediately after handling these items.  Do not share dishes, glasses, or other household items with the patient Avoid sharing household items. You should not share dishes, drinking glasses, cups, eating utensils, towels, bedding, or other items with a patient who is confirmed to have, or being evaluated for, COVID-19 infection. After the person uses these items, you should wash them thoroughly with soap and water.  Wash laundry thoroughly Immediately remove and wash clothes or bedding that have blood, body fluids, and/or secretions or excretions, such as sweat, saliva, sputum, nasal mucus, vomit, urine, or feces, on them. Wear gloves when handling laundry from the patient. Read and follow directions on labels of laundry or clothing items and detergent. In general, wash and dry with the warmest temperatures recommended on the label.  Clean all areas the individual has used often Clean all touchable surfaces, such as counters, tabletops, doorknobs, bathroom fixtures, toilets, phones, keyboards, tablets, and bedside tables, every day. Also, clean any surfaces that may have blood, body fluids, and/or secretions or excretions on them. Wear gloves when cleaning surfaces the patient has  come in contact with. Use a diluted bleach solution (e.g., dilute bleach with 1 part bleach and 10 parts water) or a household disinfectant with a label that says EPA-registered for coronaviruses. To make a bleach solution at home, add 1 tablespoon of bleach to 1 quart (4 cups) of water. For a larger supply, add  cup of bleach to 1 gallon (16 cups) of water. Read labels of cleaning products and follow recommendations provided on product labels. Labels  contain instructions for safe and effective use of the cleaning product including precautions you should take when applying the product, such as wearing gloves or eye protection and making sure you have good ventilation during use of the product. Remove gloves and wash hands immediately after cleaning.  Monitor yourself for signs and symptoms of illness Caregivers and household members are considered close contacts, should monitor their health, and will be asked to limit movement outside of the home to the extent possible. Follow the monitoring steps for close contacts listed on the symptom monitoring form.   ? If you have additional questions, contact your local health department or call the epidemiologist on call at [phone] (available 24/7). ? This guidance is subject to change. For the most up-to-date guidance from [facility], please refer to their website: [email or web address]

## 2018-07-30 NOTE — ED Provider Notes (Signed)
[facility]-EMERGENCY DEPT Provider Note   CSN: [id number] Arrival date & time: 07/30/18  1804    History   Chief Complaint Chief Complaint  Patient presents with  . Cough  . Generalized Body Aches  . Fever  . Covid exposure    HPI Yvette [name] is a 48 y.o. female.     HPI  48 year old female presents with concern for body aches and COVID-19.  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  She has recently started feeling low-grade temperatures, 99 and 100.2.  Also has had cough, sore throat and headache.  Is having body aches diffusely.  Some nausea but no vomiting.  She went to a testing center and was tested but started feeling a little more body aches so she came here.  She has been taking Tylenol.  She denies any shortness of breath or chest pain.  Past Medical History:  Diagnosis Date  . Chronic constipation   . IBS (irritable bowel syndrome)   . Menorrhagia   . OA (osteoarthritis)     Patient Active Problem List   Diagnosis Date Noted  . Upper airway cough syndrome 06/24/2018  . Menorrhagia 09/17/2015  . GERD (gastroesophageal reflux disease) 10/14/2013  . Other dysphagia 10/14/2013  . Hemorrhoids 09/28/2013  . Bloating 09/28/2013  . Unspecified constipation 09/28/2013    Past Surgical History:  Procedure Laterality Date  . BILATERAL SALPINGECTOMY Bilateral 09/17/2015   Procedure: BILATERAL SALPINGECTOMY;  Surgeon: Richarda Overlieichard Holland, MD;  [facility] [place];  Service: Gynecology;  Laterality: Bilateral;  . DIAGNOSTIC LAPAROSCOPY  10-12-2000  . HYSTEROSCOPY W/D&C  summer 2016  . LAPAROSCOPIC ASSISTED VAGINAL HYSTERECTOMY N/A 09/17/2015   Procedure: LAPAROSCOPIC ASSISTED VAGINAL HYSTERECTOMY;  Surgeon: Richarda Overlieichard Holland, MD;  [facility] [place];  Service: Gynecology;  Laterality: N/A;     OB History    Gravida  4   Para  2   Term      Preterm      AB      Living  2      SAB      TAB      Ectopic      Multiple      Live Births               Home Medications    Prior to Admission medications   Medication Sig Start Date End Date Taking? Authorizing Provider  clonazePAM (KLONOPIN) 0.5 MG tablet TAKE 1 TABLET BY MOUTH TWICE A DAY **MUST LAST 30 DAYS** 02/22/18   [provider]  fluticasone (FLONASE) 50 MCG/ACT nasal spray Place 2 sprays into both nostrils daily.    [provider]  gabapentin (NEURONTIN) 100 MG capsule Take 1 capsule (100 mg total) by mouth 3 (three) times daily. One three times daily 06/24/18   Nyoka CowdenWert, Michael B, MD  ibuprofen (ADVIL,MOTRIN) 800 MG tablet Take 1 tablet (800 mg total) by mouth every 8 (eight) hours as needed for moderate pain (mild pain). 09/22/15   Richarda OverlieHolland, Richard, MD  Multiple Vitamin (MULTIVITAMIN WITH MINERALS) TABS tablet Take 1 tablet by mouth daily.    [provider]  omeprazole (PRILOSEC) 40 MG capsule Take 1 capsule (40 mg total) by mouth every morning. Take 30-60 minutes before breakfast 04/13/18   Meredith PelGuenther, Paula M, NP  polyethylene glycol [facility](MIRALAX / GLYCOLAX) packet Take 17 g by mouth daily as needed for mild constipation.    [provider]  predniSONE (DELTASONE) 10 MG tablet Take  4 each am x 2 days,   2 each am x 2 days,  1 each am x 2 days and stop 06/24/18   Nyoka Cowden, MD  ranitidine (ZANTAC) 150 MG tablet Take 1 tablet (150 mg total) by mouth at bedtime. 04/13/18   Meredith Pel, NP  TURMERIC PO Take by mouth.    [provider]    Family History Family History  Problem Relation Age of Onset  . Diabetes Maternal Aunt   . Cirrhosis Maternal Uncle   . Allergies Mother   . Emphysema Paternal Aunt        smoked  . Colon cancer Neg Hx   . Pancreatic cancer Neg Hx     Social History Social History   Tobacco Use  . Smoking status: Never Smoker  . Smokeless tobacco: Never Used  Substance Use Topics  . Alcohol use: Yes    Comment:  OCCASIONAL  . Drug use: No     Allergies   Doxycycline and Morphine and related   Review of Systems Review of Systems  Constitutional: Positive for fever.  Respiratory: Positive for cough. Negative for shortness of breath.   Cardiovascular: Negative for chest pain.  Gastrointestinal: Negative for vomiting.  Musculoskeletal: Positive for arthralgias.  Neurological: Positive for headaches.  All other systems reviewed and are negative.    Physical Exam Updated Vital Signs BP (!) 150/83 (BP Location: Right Arm)   Pulse 96   Temp 99.9 F (37.7 C) (Oral)   Resp 19   Ht  (1.575 m)   Wt 74.4 kg   LMP 08/15/2015 (Approximate)   SpO2 98%   BMI 30.00 kg/m   Physical Exam Vitals signs and nursing note reviewed.  Constitutional:      General: She is not in acute distress.    Appearance: She is well-developed. She is not ill-appearing or diaphoretic.  HENT:     Head: Normocephalic and atraumatic.     Nose: Nose normal.  Eyes:     General:        Right eye: No discharge.        Left eye: No discharge.  Cardiovascular:     Rate and Rhythm: Normal rate.     Comments: HR 95 on monitor Pulmonary:     Effort: Pulmonary effort is normal.  Abdominal:     General: There is no distension.  Skin:    General: Skin is warm and dry.  Neurological:     Mental Status: She is alert.  Psychiatric:        Mood and Affect: Mood is not anxious.      ED Treatments / Results  Labs (all labs ordered are listed, but only abnormal results are displayed) Labs Reviewed - No data to display  EKG None  Radiology No results found.  Procedures Procedures (including critical care time)  Medications Ordered in ED Medications - No data to display   Initial Impression / Assessment and Plan / ED Course  I have reviewed the triage vital signs and the nursing notes.  Pertinent labs & imaging results that were available during my care of the patient were reviewed by me and  considered in my medical decision making (see chart for details).        Patient appears well.  She is currently being tested for COVID-19.  I think this is the most likely cause of her symptoms given she has  had multiple contacts and a mild viral illness symptoms.  However she has no increased work of breathing, vomiting, or hypoxia.  I do not think a chest x-ray will help.  I do not think labs are needed given benign vital signs besides some hypertension.  We discussed home care and self quarantining.  Discussed return precautions.  SHERNIKA NAVARRE was evaluated in Emergency Department on 07/30/2018 for the symptoms described in the history of present illness. She was evaluated in the context of the global COVID-19 pandemic, which necessitated consideration that the patient might be at risk for infection with the SARS-CoV-2 virus that causes COVID-19. Institutional protocols and algorithms that pertain to the evaluation of patients at risk for COVID-19 are in a state of rapid change based on information released by regulatory bodies including the CDC and federal and state organizations. These policies and algorithms were followed during the patient's care in the ED.   Final Clinical Impressions(s) / ED Diagnoses   Final diagnoses:  Suspected Covid-19 Virus Infection    ED Discharge Orders    None       Pricilla Loveless, MD 07/30/18 [phone]

## 2018-07-30 NOTE — ED Notes (Signed)
Bed: WLPT2 Expected date:  Expected time:  Means of arrival:  Comments: 

## 2018-07-30 NOTE — ED Notes (Signed)
ED Provider at bedside. 

## 2018-07-30 NOTE — ED Triage Notes (Signed)
Pt works at [employer] where has been exposed to Covid positive residents. For past 2 days had fevers (last dose tylenol was at 1620 today), bodyaches, headache, dry cough.

## 2018-08-09 ENCOUNTER — Emergency Department (HOSPITAL_COMMUNITY): Payer: HRSA Program

## 2018-08-09 ENCOUNTER — Inpatient Hospital Stay (HOSPITAL_COMMUNITY)
Admission: EM | Admit: 2018-08-09 | Discharge: 2018-08-13 | DRG: 179 | Disposition: A | Discharge status: Home / Self Care | Payer: HRSA Program | Attending: Internal Medicine | Admitting: Internal Medicine

## 2018-08-09 ENCOUNTER — Other Ambulatory Visit: Payer: Self-pay

## 2018-08-09 DIAGNOSIS — E869 Volume depletion, unspecified: Secondary | ICD-10-CM | POA: Diagnosis not present

## 2018-08-09 DIAGNOSIS — Z79899 Other long term (current) drug therapy: Secondary | ICD-10-CM

## 2018-08-09 DIAGNOSIS — M1990 Unspecified osteoarthritis, unspecified site: Secondary | ICD-10-CM | POA: Diagnosis present

## 2018-08-09 DIAGNOSIS — R112 Nausea with vomiting, unspecified: Secondary | ICD-10-CM | POA: Diagnosis not present

## 2018-08-09 DIAGNOSIS — Z9079 Acquired absence of other genital organ(s): Secondary | ICD-10-CM | POA: Diagnosis not present

## 2018-08-09 DIAGNOSIS — Z885 Allergy status to narcotic agent status: Secondary | ICD-10-CM | POA: Diagnosis not present

## 2018-08-09 DIAGNOSIS — Z881 Allergy status to other antibiotic agents status: Secondary | ICD-10-CM | POA: Diagnosis not present

## 2018-08-09 DIAGNOSIS — J988 Other specified respiratory disorders: Secondary | ICD-10-CM | POA: Diagnosis not present

## 2018-08-09 DIAGNOSIS — Z90710 Acquired absence of both cervix and uterus: Secondary | ICD-10-CM

## 2018-08-09 DIAGNOSIS — U071 COVID-19: Secondary | ICD-10-CM | POA: Diagnosis not present

## 2018-08-09 DIAGNOSIS — Z7951 Long term (current) use of inhaled steroids: Secondary | ICD-10-CM

## 2018-08-09 DIAGNOSIS — E876 Hypokalemia: Secondary | ICD-10-CM | POA: Diagnosis not present

## 2018-08-09 DIAGNOSIS — K581 Irritable bowel syndrome with constipation: Secondary | ICD-10-CM | POA: Diagnosis present

## 2018-08-09 DIAGNOSIS — K529 Noninfective gastroenteritis and colitis, unspecified: Secondary | ICD-10-CM | POA: Diagnosis present

## 2018-08-09 DIAGNOSIS — K219 Gastro-esophageal reflux disease without esophagitis: Secondary | ICD-10-CM | POA: Diagnosis not present

## 2018-08-09 DIAGNOSIS — R1110 Vomiting, unspecified: Secondary | ICD-10-CM | POA: Diagnosis present

## 2018-08-09 DIAGNOSIS — R748 Abnormal levels of other serum enzymes: Secondary | ICD-10-CM

## 2018-08-09 DIAGNOSIS — R7401 Elevation of levels of liver transaminase levels: Secondary | ICD-10-CM | POA: Diagnosis present

## 2018-08-09 LAB — COMPREHENSIVE METABOLIC PANEL
ALT: 148 U/L — ABNORMAL HIGH (ref 0–44)
AST: 220 U/L — ABNORMAL HIGH (ref 15–41)
Albumin: 3.6 g/dL (ref 3.5–5.0)
Alkaline Phosphatase: 118 U/L (ref 38–126)
Anion gap: 10 (ref 5–15)
BUN: 5 mg/dL — ABNORMAL LOW (ref 6–20)
CO2: 28 mmol/L (ref 22–32)
Calcium: 8.9 mg/dL (ref 8.9–10.3)
Chloride: 102 mmol/L (ref 98–111)
Creatinine, Ser: 0.6 mg/dL (ref 0.44–1.00)
GFR calc Af Amer: >60 mL/min (ref >60)
GFR calc non Af Amer: >60 mL/min (ref >60)
Glucose, Bld: 117 mg/dL — ABNORMAL HIGH (ref 70–99)
Potassium: 3.8 mmol/L (ref 3.5–5.1)
Sodium: 140 mmol/L (ref 135–145)
Total Bilirubin: 0.3 mg/dL (ref 0.3–1.2)
Total Protein: 8.2 g/dL — ABNORMAL HIGH (ref 6.5–8.1)

## 2018-08-09 LAB — CBC WITH DIFFERENTIAL/PLATELET
Abs Immature Granulocytes: 0.07 10*3/uL (ref 0.00–0.07)
Basophils Absolute: 0 10*3/uL (ref 0.0–0.1)
Basophils Relative: 0 %
Eosinophils Absolute: 0 10*3/uL (ref 0.0–0.5)
Eosinophils Relative: 0 %
HCT: 38 % (ref 36.0–46.0)
Hemoglobin: 12.6 g/dL (ref 12.0–15.0)
Immature Granulocytes: 1 %
Lymphocytes Relative: 18 %
Lymphs Abs: 1.3 10*3/uL (ref 0.7–4.0)
MCH: 30.1 pg (ref 26.0–34.0)
MCHC: 33.2 g/dL (ref 30.0–36.0)
MCV: 90.9 fL (ref 80.0–100.0)
Monocytes Absolute: 0.5 10*3/uL (ref 0.1–1.0)
Monocytes Relative: 7 %
Neutro Abs: 5 10*3/uL (ref 1.7–7.7)
Neutrophils Relative %: 74 %
Platelets: 354 10*3/uL (ref 150–400)
RBC: 4.18 MIL/uL (ref 3.87–5.11)
RDW: 13.4 % (ref 11.5–15.5)
WBC: 6.9 10*3/uL (ref 4.0–10.5)
nRBC: 0 % (ref 0.0–0.2)

## 2018-08-09 LAB — URINALYSIS, ROUTINE W REFLEX MICROSCOPIC
Bilirubin Urine: NEGATIVE
Glucose, UA: NEGATIVE mg/dL
Hgb urine dipstick: NEGATIVE
Ketones, ur: NEGATIVE mg/dL
Leukocytes,Ua: NEGATIVE
Nitrite: NEGATIVE
Protein, ur: NEGATIVE mg/dL
Specific Gravity, Urine: 1.008 (ref 1.005–1.030)
pH: 8 (ref 5.0–8.0)

## 2018-08-09 LAB — LACTIC ACID, PLASMA: Lactic Acid, Venous: 0.8 mmol/L (ref 0.5–1.9)

## 2018-08-09 LAB — TRIGLYCERIDES: Triglycerides: 120 mg/dL (ref <150)

## 2018-08-09 LAB — LIPASE, BLOOD: Lipase: 51 U/L (ref 11–51)

## 2018-08-09 LAB — I-STAT BETA HCG BLOOD, ED (MC, WL, AP ONLY): I-stat hCG, quantitative: <5 m[IU]/mL (ref <5)

## 2018-08-09 LAB — C-REACTIVE PROTEIN: CRP: 7.9 mg/dL — ABNORMAL HIGH (ref <1.0)

## 2018-08-09 LAB — PROCALCITONIN: Procalcitonin: <0.1 ng/mL

## 2018-08-09 LAB — SARS CORONAVIRUS 2 BY RT PCR (HOSPITAL ORDER, PERFORMED IN ~~LOC~~ HOSPITAL LAB): SARS Coronavirus 2: POSITIVE — AB

## 2018-08-09 LAB — LACTATE DEHYDROGENASE: LDH: 416 U/L — ABNORMAL HIGH (ref 98–192)

## 2018-08-09 LAB — FIBRINOGEN: Fibrinogen: 641 mg/dL — ABNORMAL HIGH (ref 210–475)

## 2018-08-09 LAB — FERRITIN: Ferritin: 2439 ng/mL — ABNORMAL HIGH (ref 11–307)

## 2018-08-09 LAB — D-DIMER, QUANTITATIVE: D-Dimer, Quant: 0.45 ug/mL-FEU (ref 0.00–0.50)

## 2018-08-09 LAB — ABO/RH: ABO/RH(D): A POS

## 2018-08-09 MED ORDER — GABAPENTIN 100 MG PO CAPS
100.0000 mg | ORAL_CAPSULE | Freq: Three times a day (TID) | ORAL | Status: DC
  Filled 2018-08-09 (×15): qty 1

## 2018-08-09 MED ORDER — POLYETHYLENE GLYCOL 3350 17 G PO PACK
17.0000 g | PACK | Freq: Every day | ORAL | Status: DC | PRN
  Administered 2018-08-11: 17 g via ORAL
  Filled 2018-08-09: qty 1

## 2018-08-09 MED ORDER — SODIUM CHLORIDE 0.9 % IV SOLN: INTRAVENOUS | Status: DC

## 2018-08-09 MED ORDER — GUAIFENESIN-DM 100-10 MG/5ML PO SYRP
10.0000 mL | ORAL_SOLUTION | ORAL | Status: DC | PRN
  Administered 2018-08-12: 10 mL via ORAL
  Filled 2018-08-09: qty 10

## 2018-08-09 MED ORDER — SODIUM CHLORIDE 0.9 % IV BOLUS
1000.0000 mL | Freq: Once | INTRAVENOUS | Status: AC
  Administered 2018-08-09: 1000 mL via INTRAVENOUS

## 2018-08-09 MED ORDER — ONDANSETRON HCL 4 MG PO TABS
4.0000 mg | ORAL_TABLET | Freq: Four times a day (QID) | ORAL | Status: DC | PRN
  Administered 2018-08-10: 4 mg via ORAL
  Filled 2018-08-09: qty 1

## 2018-08-09 MED ORDER — ONDANSETRON HCL 4 MG/2ML IJ SOLN
4.0000 mg | Freq: Four times a day (QID) | INTRAMUSCULAR | Status: DC | PRN
  Filled 2018-08-09: qty 2

## 2018-08-09 MED ORDER — ALBUTEROL SULFATE HFA 108 (90 BASE) MCG/ACT IN AERS
2.0000 | INHALATION_SPRAY | RESPIRATORY_TRACT | Status: DC | PRN
  Administered 2018-08-09 – 2018-08-10 (×2): 2 via RESPIRATORY_TRACT
  Filled 2018-08-09: qty 6.7

## 2018-08-09 MED ORDER — ONDANSETRON HCL 4 MG/2ML IJ SOLN
4.0000 mg | Freq: Once | INTRAMUSCULAR | Status: AC
  Administered 2018-08-09: 4 mg via INTRAVENOUS
  Filled 2018-08-09: qty 2

## 2018-08-09 MED ORDER — CLONAZEPAM 0.5 MG PO TABS
0.5000 mg | ORAL_TABLET | Freq: Two times a day (BID) | ORAL | Status: DC
  Administered 2018-08-10 – 2018-08-13 (×5): 0.5 mg via ORAL
  Filled 2018-08-09 (×8): qty 1

## 2018-08-09 MED ORDER — HYDROCOD POLST-CPM POLST ER 10-8 MG/5ML PO SUER
5.0000 mL | Freq: Two times a day (BID) | ORAL | Status: DC | PRN
  Administered 2018-08-10 – 2018-08-12 (×4): 5 mL via ORAL
  Filled 2018-08-09 (×5): qty 5

## 2018-08-09 MED ORDER — PANTOPRAZOLE SODIUM 40 MG PO TBEC
80.0000 mg | DELAYED_RELEASE_TABLET | Freq: Every day | ORAL | Status: DC
  Administered 2018-08-10 – 2018-08-13 (×4): 80 mg via ORAL
  Filled 2018-08-09 (×4): qty 2

## 2018-08-09 MED ORDER — ENOXAPARIN SODIUM 40 MG/0.4ML ~~LOC~~ SOLN
40.0000 mg | Freq: Every day | SUBCUTANEOUS | Status: DC
  Administered 2018-08-10 – 2018-08-12 (×4): 40 mg via SUBCUTANEOUS
  Filled 2018-08-09 (×4): qty 0.4

## 2018-08-09 MED ORDER — ADULT MULTIVITAMIN W/MINERALS CH
1.0000 | ORAL_TABLET | Freq: Every day | ORAL | Status: DC
  Filled 2018-08-09 (×3): qty 1

## 2018-08-09 MED ORDER — FLUTICASONE PROPIONATE 50 MCG/ACT NA SUSP
2.0000 | Freq: Every day | NASAL | Status: DC
  Administered 2018-08-10 – 2018-08-13 (×4): 2 via NASAL
  Filled 2018-08-09: qty 16

## 2018-08-09 MED ORDER — FAMOTIDINE 20 MG PO TABS
40.0000 mg | ORAL_TABLET | Freq: Every day | ORAL | Status: DC
  Administered 2018-08-10 – 2018-08-12 (×4): 40 mg via ORAL
  Filled 2018-08-09 (×4): qty 2

## 2018-08-09 NOTE — ED Notes (Signed)
Bed: BB04 Expected date:  Expected time:  Means of arrival:  Comments: EMS covid + dehydration weakness

## 2018-08-09 NOTE — ED Notes (Signed)
Patient given ice chips for PO challenge.

## 2018-08-09 NOTE — ED Notes (Signed)
Pt said, she is not prescribed an inhaler but needs one to assist her breathing tonight. She has more difficulty breathing at night due to tightness. She uses her daughters Proair inhaler 2 puffs every 6 hours and that helps.

## 2018-08-09 NOTE — ED Provider Notes (Signed)
Care assumed from Dr. Patria Mane.  At time of transfer care, patient is awaiting p.o. challenge after fluids for nausea and vomiting and abdominal discomfort.  Patient is laboratory testing confirmed coronavirus diagnosis.  Patient is not pregnant and lipase is normal.  CBC was reassuring but metabolic panel showed LFT elevation.  Right upper quadrant ultrasound was performed after this was discovered and did not show acute cholecystitis.  After 2 L of fluid, patient continues to have nausea and was unable to tolerate any ice chips.  Patient will be given Zofran IV.  If she is still unable to tolerate maintaining hydration, she will need to be admitted for rehydration.  7:03 PM Patient reports that after fluids and nausea medicine, she is still having nausea and spitting up.  She does not feel safe going home with the abdominal pain, nausea, and intolerance of p.o.  Suspect this is due to coronavirus.  Hepatitis panel still in process.  Per previous plan, as patient is unable to tolerate p.o., she will be admitted.  Hospitalist team called for admission.  Clinical Impression: 1. Nausea & vomiting   2. Elevated liver enzymes     Disposition: Admit  This note was prepared with assistance of Dragon voice recognition software. Occasional wrong-word or sound-a-like substitutions may have occurred due to the inherent limitations of voice recognition software.   Yvette [name] was evaluated in Emergency Department on 08/09/2018 for the symptoms described in the history of present illness. She was evaluated in the context of the global COVID-19 pandemic, which necessitated consideration that the patient might be at risk for infection with the SARS-CoV-2 virus that causes COVID-19. Institutional protocols and algorithms that pertain to the evaluation of patients at risk for COVID-19 are in a state of rapid change based on information released by regulatory bodies including the CDC and federal and state  organizations. These policies and algorithms were followed during the patient's care in the ED.        Yvette [name], Canary Brim, MD 08/09/18 2015

## 2018-08-09 NOTE — ED Notes (Signed)
ED TO INPATIENT HANDOFF REPORT  S Name/Age/Gender Yvette [name] 48 y.o. female Room/Bed: WA18/WA18  Code Status   Code Status: Full Code  Home/SNF/Other Home Patient oriented to: AOX4 Is this baseline? Yes   Triage Complete: Triage complete  Chief Complaint covid+ dehydration weakness nausea  Triage Note Pt brought in by EMS from home. Reports positive COVID result on Saturday. Since, pt has not been able to eat/drink. Denies SOB. Per EMS lung sounds clear.    Allergies Allergies  Allergen Reactions  . Doxycycline Rash  . Morphine And Related Palpitations    Level of Care/Admitting Diagnosis ED Disposition    ED Disposition Condition Comment   Admit  Hospital Area: [facility] [[id number]]  Level of Care: Med-Surg [16]  Covid Evaluation: Confirmed COVID Positive  Isolation Risk Level: Low Risk/Droplet (Less than 4L [place] supplementation)  Diagnosis: COVID-19 [[id number]]  Admitting Physician: Hillary Bow [phone]  Attending Physician: Hillary Bow [[id number]  PT Class (Do Not Modify): Observation [104]  PT Acc Code (Do Not Modify): Observation [10022]       B Medical/Surgery History Past Medical History:  Diagnosis Date  . Chronic constipation   . IBS (irritable bowel syndrome)   . Menorrhagia   . OA (osteoarthritis)    Past Surgical History:  Procedure Laterality Date  . BILATERAL SALPINGECTOMY Bilateral 09/17/2015   Procedure: BILATERAL SALPINGECTOMY;  Surgeon: Richarda Overlie, MD;  Location: [facility];  Service: Gynecology;  Laterality: Bilateral;  . DIAGNOSTIC LAPAROSCOPY  10-12-2000  . HYSTEROSCOPY W/D&C  summer 2016  . LAPAROSCOPIC ASSISTED VAGINAL HYSTERECTOMY N/A 09/17/2015   Procedure: LAPAROSCOPIC ASSISTED VAGINAL HYSTERECTOMY;  Surgeon: Richarda Overlie, MD;  Location: [facility];  Service: Gynecology;  Laterality: N/A;     A IV Location/Drains/Wounds Patient Lines/Drains/Airways Status    Active Line/Drains/Airways    Name:   Placement date:   Placement time:   Site:   Days:   Peripheral IV 08/09/18 Right Antecubital   08/09/18    0940    Antecubital   less than 1   Airway 7 mm   09/17/15    0735     1057   Incision (Closed) 09/17/15 Perineum   09/17/15    0836     1057   Incision (Closed) 09/17/15 Abdomen   09/17/15    0843     1057   Incision - 2 Ports Abdomen Umbilicus    09/17/15    0841     1057          Intake/Output Last 24 hours  Intake/Output Summary (Last 24 hours) at 08/09/2018 2127 Last data filed at 08/09/2018 2042 Gross per 24 hour  Intake 990 ml  Output -  Net 990 ml    Labs/Imaging Results for orders placed or performed during the hospital encounter of 08/09/18 (from the past 48 hour(s))  Lactic acid, plasma     Status: None   Collection Time: 08/09/18  9:24 AM  Result Value Ref Range   Lactic Acid, Venous 0.8 0.5 - 1.9 mmol/L    Comment: Performed at [facility], [address], [place], [place] [address]  Blood Culture (routine x 2)     Status: None (Preliminary result)   Collection Time: 08/09/18  9:24 AM  Result Value Ref Range   Specimen Description      BLOOD LEFT ANTECUBITAL Performed at [facility], [address] [place], [place] [address]  Special Requests      BOTTLES DRAWN AEROBIC AND ANAEROBIC Blood Culture results may not be optimal due to an excessive volume of blood received in culture bottles Performed at [facility], [address] [place], [place] [address]    Culture PENDING    Report Status PENDING   CBC WITH DIFFERENTIAL     Status: None   Collection Time: 08/09/18  9:24 AM  Result Value Ref Range   WBC 6.9 4.0 - 10.5 K/uL   RBC 4.18 3.87 - 5.11 MIL/uL   Hemoglobin 12.6 12.0 - 15.0 g/dL   HCT 91.438.0 78.236.0 - 95.646.0 %   MCV 90.9 80.0 - 100.0 fL   MCH 30.1 26.0 - 34.0 pg   MCHC 33.2 30.0 - 36.0 g/dL   RDW 21.313.4 08.611.5 - 57.815.5 %   Platelets 354 150 - 400 K/uL   nRBC 0.0  0.0 - 0.2 %   Neutrophils Relative % 74 %   Neutro Abs 5.0 1.7 - 7.7 K/uL   Lymphocytes Relative 18 %   Lymphs Abs 1.3 0.7 - 4.0 K/uL   Monocytes Relative 7 %   Monocytes Absolute 0.5 0.1 - 1.0 K/uL   Eosinophils Relative 0 %   Eosinophils Absolute 0.0 0.0 - 0.5 K/uL   Basophils Relative 0 %   Basophils Absolute 0.0 0.0 - 0.1 K/uL   Immature Granulocytes 1 %   Abs Immature Granulocytes 0.07 0.00 - 0.07 K/uL    Comment: Performed at [facility], [address] [place], [place] [address]  Comprehensive metabolic panel     Status: Abnormal   Collection Time: 08/09/18  9:24 AM  Result Value Ref Range   Sodium 140 135 - 145 mmol/L   Potassium 3.8 3.5 - 5.1 mmol/L   Chloride 102 98 - 111 mmol/L   CO2 28 22 - 32 mmol/L   Glucose, Bld 117 (H) 70 - 99 mg/dL   BUN 5 (L) 6 - 20 mg/dL   Creatinine, Ser 9.520.60 0.44 - 1.00 mg/dL   Calcium 8.9 8.9 - 84.110.3 mg/dL   Total Protein 8.2 (H) 6.5 - 8.1 g/dL   Albumin 3.6 3.5 - 5.0 g/dL   AST 324220 (H) 15 - 41 U/L   ALT 148 (H) 0 - 44 U/L   Alkaline Phosphatase 118 38 - 126 U/L   Total Bilirubin 0.3 0.3 - 1.2 mg/dL   GFR calc non Af Amer >60 >60 mL/min   GFR calc Af Amer >60 >60 mL/min   Anion gap 10 5 - 15    Comment: Performed at [facility], [address] [place], [place] [address]  D-dimer, quantitative     Status: None   Collection Time: 08/09/18  9:24 AM  Result Value Ref Range   D-Dimer, Quant 0.45 0.00 - 0.50 ug/mL-FEU    Comment: (NOTE) At the manufacturer cut-off of 0.50 ug/mL FEU, this assay has been documented to exclude PE with a sensitivity and negative predictive value of 97 to 99%.  At this time, this assay has not been approved by the FDA to exclude DVT/VTE. Results should be correlated with clinical presentation. Performed at [facility], [address] [place], [place] [address]   Procalcitonin     Status: None   Collection Time: 08/09/18  9:24 AM  Result Value  Ref Range   Procalcitonin <0.10 ng/mL    Comment:        Interpretation: PCT (Procalcitonin) <= 0.5  ng/mL: Systemic infection (sepsis) is not likely. Local bacterial infection is possible. (NOTE)       Sepsis PCT Algorithm           Lower Respiratory Tract                                      Infection PCT Algorithm    ----------------------------     ----------------------------         PCT < 0.25 ng/mL                PCT < 0.10 ng/mL         Strongly encourage             Strongly discourage   discontinuation of antibiotics    initiation of antibiotics    ----------------------------     -----------------------------       PCT 0.25 - 0.50 ng/mL            PCT 0.10 - 0.25 ng/mL               OR       >80% decrease in PCT            Discourage initiation of                                            antibiotics      Encourage discontinuation           of antibiotics    ----------------------------     -----------------------------         PCT >= 0.50 ng/mL              PCT 0.26 - 0.50 ng/mL               AND        <80% decrease in PCT             Encourage initiation of                                             antibiotics       Encourage continuation           of antibiotics    ----------------------------     -----------------------------        PCT >= 0.50 ng/mL                  PCT > 0.50 ng/mL               AND         increase in PCT                  Strongly encourage                                      initiation of antibiotics    Strongly encourage escalation           of antibiotics                                     -----------------------------  PCT <= 0.25 ng/mL                                                 OR                                        > 80% decrease in PCT                                     Discontinue / Do not initiate                                             antibiotics Performed at [facility], [address], [place], [place] [address]   Lactate dehydrogenase     Status: Abnormal   Collection Time: 08/09/18  9:24 AM  Result Value Ref Range   LDH 416 (H) 98 - 192 U/L    Comment: Performed at [facility], [address] [place], [place] [address]  Ferritin     Status: Abnormal   Collection Time: 08/09/18  9:24 AM  Result Value Ref Range   Ferritin 2,439 (H) 11 - 307 ng/mL    Comment: Performed at [facility], [address], [place], [place] [address]  Triglycerides     Status: None   Collection Time: 08/09/18  9:24 AM  Result Value Ref Range   Triglycerides 120 <150 mg/dL    Comment: Performed at [facility], [address] [place], [place] [address]  Fibrinogen     Status: Abnormal   Collection Time: 08/09/18  9:24 AM  Result Value Ref Range   Fibrinogen 641 (H) 210 - 475 mg/dL    Comment: Performed at [facility], [address], [place], [place] [address]  C-reactive protein     Status: Abnormal   Collection Time: 08/09/18  9:24 AM  Result Value Ref Range   CRP 7.9 (H) <1.0 mg/dL    Comment: Performed at [facility], [address], [place], [place] [address]  Blood Culture (routine x 2)     Status: None (Preliminary result)   Collection Time: 08/09/18  9:29 AM  Result Value Ref Range   Specimen Description      BLOOD RIGHT ANTECUBITAL Performed at [facility], [address] [place], [place] [address]    Special Requests      BOTTLES DRAWN AEROBIC AND ANAEROBIC Blood Culture adequate volume Performed at [facility], [address] [place], [place] [address]    Culture PENDING    Report Status PENDING   SARS Coronavirus 2 [facility] order, Performed in [facility] hospital lab)     Status: Abnormal   Collection Time: 08/09/18  9:46 AM  Result Value Ref Range   SARS Coronavirus 2 POSITIVE (A) NEGATIVE     Comment: RESULT CALLED TO, READ BACK BY AND VERIFIED WITH: K.HALL RN AT 1100 ON 08/09/2018 BY S.VANHOORNE (NOTE) If result is NEGATIVE SARS-CoV-2 target nucleic acids are NOT DETECTED. The  SARS-CoV-2 RNA is generally detectable in upper and lower  respiratory specimens during the acute phase of infection. The lowest  concentration of SARS-CoV-2 viral copies this assay can detect is 250  copies / mL. A negative result does not preclude SARS-CoV-2 infection  and should not be used as the sole basis for treatment or other  patient management decisions.  A negative result may occur with  improper specimen collection / handling, submission of specimen other  than nasopharyngeal swab, presence of viral mutation(s) within the  areas targeted by this assay, and inadequate number of viral copies  (<250 copies / mL). A negative result must be combined with clinical  observations, patient history, and epidemiological information. If result is POSITIVE SARS-CoV-2 target nucleic acids are DE TECTED. The SARS-CoV-2 RNA is generally detectable in upper and lower  respiratory specimens during the acute phase of infection.  Positive  results are indicative of active infection with SARS-CoV-2.  Clinical  correlation with patient history and other diagnostic information is  necessary to determine patient infection status.  Positive results do  not rule out bacterial infection or co-infection with other viruses. If result is PRESUMPTIVE POSTIVE SARS-CoV-2 nucleic acids MAY BE PRESENT.   A presumptive positive result was obtained on the submitted specimen  and confirmed on repeat testing.  While 2019 novel coronavirus  (SARS-CoV-2) nucleic acids may be present in the submitted sample  additional confirmatory testing may be necessary for epidemiological  and / or clinical management purposes  to differentiate between  SARS-CoV-2 and other Sarbecovirus currently known to infect humans.  If clinically  indicated additional testing with an alternate test  methodology (L [id number]) is advised. The SARS-CoV-2 RNA is generally  detectable in upper and lower respiratory specimens during the acute  phase of infection. The expected result is Negative. Fact Sheet for Patients:  BoilerBrush.com.cy Fact Sheet for Healthcare Providers: https://pope.com/ This test is not yet approved or cleared by the [place] FDA and has been authorized for detection and/or diagnosis of SARS-CoV-2 by FDA under an Emergency Use Authorization (EUA).  This EUA will remain in effect (meaning this test can be used) for the duration of the COVID-19 declaration under Section 564(b)(1) of the Act, 21 U.S.C. section 360bbb-3(b)(1), unless the authorization is terminated or revoked sooner. Performed at [facility], [address] [place], [place] [address]   Lipase, blood     Status: None   Collection Time: 08/09/18  9:46 AM  Result Value Ref Range   Lipase 51 11 - 51 U/L    Comment: Performed at [facility], [address] [place], [place] [address]  I-Stat beta hCG blood, ED     Status: None   Collection Time: 08/09/18 10:04 AM  Result Value Ref Range   I-stat hCG, quantitative <5.0 <5 mIU/mL   Comment 3            Comment:   GEST. AGE      CONC.  (mIU/mL)   <=1 WEEK        5 - 50     2 WEEKS       50 - 500     3 WEEKS       100 - 10,000     4 WEEKS     1,000 - 30,000        FEMALE AND NON-PREGNANT FEMALE:     LESS THAN 5 mIU/mL   Urinalysis, Routine w reflex microscopic  Status: Abnormal   Collection Time: 08/09/18 11:55 AM  Result Value Ref Range   Color, Urine STRAW (A) YELLOW   APPearance CLEAR CLEAR   Specific Gravity, Urine 1.008 1.005 - 1.030   pH 8.0 5.0 - 8.0   Glucose, UA NEGATIVE NEGATIVE mg/dL   Hgb urine dipstick NEGATIVE NEGATIVE   Bilirubin Urine NEGATIVE NEGATIVE   Ketones, ur NEGATIVE NEGATIVE  mg/dL   Protein, ur NEGATIVE NEGATIVE mg/dL   Nitrite NEGATIVE NEGATIVE   Leukocytes,Ua NEGATIVE NEGATIVE    Comment: Performed at [facility], [address], [place], [place] [address]   Dg Chest Port 1 View  Result Date: 08/09/2018 CLINICAL DATA:  48 year old female with increasing weakness EXAM: PORTABLE CHEST 1 VIEW COMPARISON:  12/30/2017, 05/31/2018 FINDINGS: Low lung volumes with linear opacities at the lung bases. Cardiomediastinal silhouette likely unchanged given the low lung volumes. No pleural effusion or pneumothorax. No confluent airspace disease. No displaced fracture. IMPRESSION: Low lung volumes with likely basilar atelectasis. Electronically Signed   By: Gilmer Mor D.O.   On: 08/09/2018 10:16   US Abdomen Limited Ruq  Result Date: 08/09/2018 CLINICAL DATA:  Elevated LFTs. EXAM: ULTRASOUND ABDOMEN LIMITED RIGHT UPPER QUADRANT COMPARISON:  Ultrasound 12/30/2011 and chest CT 03/14/2017 FINDINGS: Gallbladder: No gallstones or wall thickening visualized. No sonographic Murphy sign noted by sonographer. Common bile duct: Diameter: 3.5 mm. Liver: No focal lesion identified. Within normal limits in parenchymal echogenicity. Portal vein is patent on color Doppler imaging with normal direction of blood flow towards the liver. IMPRESSION: Normal right upper quadrant ultrasound. Electronically Signed   By: Elberta Fortis M.D.   On: 08/09/2018 14:48    Pending Labs Unresulted Labs (From admission, onward)    Start     Ordered   08/10/18 0500  CBC with Differential/Platelet  Daily,   R     08/09/18 2021   08/10/18 0500  C-reactive protein  Daily,   R     08/09/18 2021   08/10/18 0500  Comprehensive metabolic panel  Daily,   R     08/09/18 2021   08/09/18 2019  HIV antibody (Routine Testing)  Once,   R     08/09/18 2021   08/09/18 2019  ABO/Rh  Once,   R     08/09/18 2021   08/09/18 1031  Hepatitis panel, acute  ONCE - STAT,   STAT     08/09/18 1030           Vitals/Pain Today's Vitals   08/09/18 1930 08/09/18 2000 08/09/18 2030 08/09/18 2100  BP: 129/70 119/69 (!) 113/52 139/71  Pulse: 82 89 (!) 103 82  Resp: 18 (!) 25 (!) 22 17  Temp:      TempSrc:      SpO2: 100% 99% 100% 100%  Weight:      Height:      PainSc:        Isolation Precautions Airborne and Contact precautions  Medications Medications  albuterol (VENTOLIN HFA) 108 (90 Base) MCG/ACT inhaler 2 puff (has no administration in time range)  clonazePAM (KLONOPIN) tablet 0.5 mg (has no administration in time range)  fluticasone (FLONASE) 50 MCG/ACT nasal spray 2 spray (has no administration in time range)  gabapentin (NEURONTIN) capsule 100 mg (has no administration in time range)  multivitamin with minerals tablet 1 tablet (has no administration in time range)  pantoprazole (PROTONIX) EC tablet 80 mg (has no administration in time range)  polyethylene glycol (MIRALAX / GLYCOLAX) packet  17 g (has no administration in time range)  famotidine (PEPCID) tablet 40 mg (has no administration in time range)  enoxaparin (LOVENOX) injection 40 mg (has no administration in time range)  guaiFENesin-dextromethorphan (ROBITUSSIN DM) 100-10 MG/5ML syrup 10 mL (has no administration in time range)  chlorpheniramine-HYDROcodone (TUSSIONEX) 10-8 MG/5ML suspension 5 mL (has no administration in time range)  ondansetron (ZOFRAN) tablet 4 mg (has no administration in time range)    Or  ondansetron (ZOFRAN) injection 4 mg (has no administration in time range)  sodium chloride 0.9 % bolus 1,000 mL (0 mLs Intravenous Stopped 08/09/18 1050)  sodium chloride 0.9 % bolus 1,000 mL (0 mLs Intravenous Stopped 08/09/18 1300)  ondansetron (ZOFRAN) injection 4 mg (4 mg Intravenous Given 08/09/18 1735)  sodium chloride 0.9 % bolus 1,000 mL (0 mLs Intravenous Stopped 08/09/18 2042)    Mobility walks Low fall risk   Focused Assessments Pulmonary Assessment Handoff:  Lung sounds:   O2 Device: Room  Air        R Recommendations: See Admitting Provider Note  Report given to:   Additional Notes: NA

## 2018-08-09 NOTE — ED Notes (Signed)
EDP at bedside  

## 2018-08-09 NOTE — ED Notes (Signed)
After PO challenge with ice chips per MD request, pt c/o nausea. This nurse notified EDP

## 2018-08-09 NOTE — ED Notes (Signed)
Date and time results received: 08/09/18  (use smartphrase ".now" to insert current time)  Test: Covid Critical Value: Positive  Name of Provider Notified: Kizzie Ide Primary RNs  Orders Received? Or Actions Taken?:

## 2018-08-09 NOTE — ED Triage Notes (Signed)
Pt brought in by EMS from home. Reports positive COVID result on Saturday. Since, pt has not been able to eat/drink. Denies SOB. Per EMS lung sounds clear.

## 2018-08-09 NOTE — H&P (Signed)
History and Physical    Yvette [name] YNW:[id number] DOB: [birth year] DOA: 08/09/2018  PCP: Georgianne Fick, MD  Patient coming from: Home  I have personally briefly reviewed patient's old medical records in [facility]  Chief Complaint: N/V, cough, COVID  HPI: Yvette [name] is a 48 y.o. female with medical history significant of IBS and chronic constipation.  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  She has been ill for the past 10 days with cough, N/V, fever, body aches.  Cough and SOB seems to be worse at night   ED Course: COVID-19 positive, CXR neg, d.dimer neg, pro calcitonin neg.  CRP 7.9.  N/V continues despite anti-emetics.  Given 2L NS bolus  CMP significant for AST 220, ALT 148   Review of Systems: As per HPI otherwise 10 point review of systems negative.   Past Medical History:  Diagnosis Date  . Chronic constipation   . IBS (irritable bowel syndrome)   . Menorrhagia   . OA (osteoarthritis)     Past Surgical History:  Procedure Laterality Date  . BILATERAL SALPINGECTOMY Bilateral 09/17/2015   Procedure: BILATERAL SALPINGECTOMY;  Surgeon: Richarda Overlie, MD;  [facility];  Service: Gynecology;  Laterality: Bilateral;  . DIAGNOSTIC LAPAROSCOPY  10-12-2000  . HYSTEROSCOPY W/D&C  summer 2016  . LAPAROSCOPIC ASSISTED VAGINAL HYSTERECTOMY N/A 09/17/2015   Procedure: LAPAROSCOPIC ASSISTED VAGINAL HYSTERECTOMY;  Surgeon: Richarda Overlie, MD;  Location: [facility];  Service: Gynecology;  Laterality: N/A;     reports that she has never smoked. She has never used smokeless tobacco. She reports current alcohol use. She reports that she does not use drugs.  Allergies  Allergen Reactions  . Doxycycline Rash  . Morphine And Related Palpitations    Family History  Problem Relation Age of Onset  . Diabetes Maternal Aunt   . Cirrhosis Maternal Uncle    . Allergies Mother   . Emphysema Paternal Aunt        smoked  . Colon cancer Neg Hx   . Pancreatic cancer Neg Hx      Prior to Admission medications   Medication Sig Start Date End Date Taking? Authorizing Provider  clonazePAM (KLONOPIN) 0.5 MG tablet Take 0.5 mg by mouth 2 (two) times daily.  02/22/18  Yes [provider]  fluticasone (FLONASE) 50 MCG/ACT nasal spray Place 2 sprays into both nostrils daily.   Yes [provider]  gabapentin (NEURONTIN) 100 MG capsule Take 1 capsule (100 mg total) by mouth 3 (three) times daily. One three times daily 06/24/18  Yes Nyoka Cowden, MD  ibuprofen (ADVIL,MOTRIN) 800 MG tablet Take 1 tablet (800 mg total) by mouth every 8 (eight) hours as needed for moderate pain (mild pain). 09/22/15  Yes Richarda Overlie, MD  Multiple Vitamin (MULTIVITAMIN WITH MINERALS) TABS tablet Take 1 tablet by mouth daily.   Yes [provider]  omeprazole (PRILOSEC) 40 MG capsule Take 1 capsule (40 mg total) by mouth every morning. Take 30-60 minutes before breakfast 04/13/18  Yes Meredith Pel, NP  polyethylene glycol (MIRALAX / GLYCOLAX) packet Take 17 g by mouth daily as needed for mild constipation.   Yes [provider]  ranitidine (ZANTAC) 150 MG tablet Take 1 tablet (150 mg total) by mouth at bedtime. 04/13/18  Yes Meredith Pel, NP  TURMERIC PO Take by mouth.   Yes [provider]  Physical Exam: Vitals:   08/09/18 1800 08/09/18 1830 08/09/18 1900 08/09/18 1930  BP: 123/75 128/72 124/77 129/70  Pulse: 80 81 80 82  Resp: (!) 22 (!) 23 20 18   Temp:      TempSrc:      SpO2: 99% 99% 100% 100%  Weight:      Height:        Constitutional: NAD, calm, comfortable, Cough noted over phone  Patient evaluated as a Tele medicine admission due to positive COVID-19.   Labs on Admission: I have personally reviewed following labs and imaging studies  CBC: Recent Labs  Lab 08/09/18 0924  WBC 6.9  NEUTROABS  5.0  HGB 12.6  HCT 38.0  MCV 90.9  PLT 354   Basic Metabolic Panel: Recent Labs  Lab 08/09/18 0924  NA 140  K 3.8  CL 102  CO2 28  GLUCOSE 117*  BUN 5*  CREATININE 0.60  CALCIUM 8.9   GFR: Estimated Creatinine Clearance: 82.1 mL/min (by C-G formula based on SCr of 0.6 mg/dL). Liver Function Tests: Recent Labs  Lab 08/09/18 0924  AST 220*  ALT 148*  ALKPHOS 118  BILITOT 0.3  PROT 8.2*  ALBUMIN 3.6   Recent Labs  Lab 08/09/18 0946  LIPASE 51   No results for input(s): AMMONIA in the last 168 hours. Coagulation Profile: No results for input(s): INR, PROTIME in the last 168 hours. Cardiac Enzymes: No results for input(s): CKTOTAL, CKMB, CKMBINDEX, TROPONINI in the last 168 hours. BNP (last 3 results) No results for input(s): PROBNP in the last 8760 hours. HbA1C: No results for input(s): HGBA1C in the last 72 hours. CBG: No results for input(s): GLUCAP in the last 168 hours. Lipid Profile: Recent Labs    08/09/18 0924  TRIG 120   Thyroid Function Tests: No results for input(s): TSH, T4TOTAL, FREET4, T3FREE, THYROIDAB in the last 72 hours. Anemia Panel: Recent Labs    08/09/18 0924  FERRITIN 2,439*   Urine analysis:    Component Value Date/Time   COLORURINE STRAW (A) 08/09/2018 1155   APPEARANCEUR CLEAR 08/09/2018 1155   LABSPEC 1.008 08/09/2018 1155   PHURINE 8.0 08/09/2018 1155   GLUCOSEU NEGATIVE 08/09/2018 1155   HGBUR NEGATIVE 08/09/2018 1155   BILIRUBINUR NEGATIVE 08/09/2018 1155   KETONESUR NEGATIVE 08/09/2018 1155   PROTEINUR NEGATIVE 08/09/2018 1155   UROBILINOGEN 0.2 12/02/2014 1400   NITRITE NEGATIVE 08/09/2018 1155   LEUKOCYTESUR NEGATIVE 08/09/2018 1155    Radiological Exams on Admission: Dg Chest Port 1 View  Result Date: 08/09/2018 CLINICAL DATA:  48 year old female with increasing weakness EXAM: PORTABLE CHEST 1 VIEW COMPARISON:  12/30/2017, 05/31/2018 FINDINGS: Low lung volumes with linear opacities at the lung bases.  Cardiomediastinal silhouette likely unchanged given the low lung volumes. No pleural effusion or pneumothorax. No confluent airspace disease. No displaced fracture. IMPRESSION: Low lung volumes with likely basilar atelectasis. Electronically Signed   By: Gilmer MorJaime  Wagner D.O.   On: 08/09/2018 10:16   [place] Abdomen Limited Ruq  Result Date: 08/09/2018 CLINICAL DATA:  Elevated LFTs. EXAM: ULTRASOUND ABDOMEN LIMITED RIGHT UPPER QUADRANT COMPARISON:  Ultrasound 12/30/2011 and chest CT 03/14/2017 FINDINGS: Gallbladder: No gallstones or wall thickening visualized. No sonographic Murphy sign noted by sonographer. Common bile duct: Diameter: 3.5 mm. Liver: No focal lesion identified. Within normal limits in parenchymal echogenicity. Portal vein is patent on color Doppler imaging with normal direction of blood flow towards the liver. IMPRESSION: Normal right upper quadrant ultrasound. Electronically Signed   By: Elberta Fortisaniel  Boyle  M.D.   On: 08/09/2018 14:48    EKG: Independently reviewed.  Assessment/Plan Principal Problem:   COVID-19 Active Problems:   Intractable nausea and vomiting    1. COVID-19 - 1. No O2 requirement, no PNA on CXR, does have cough. 1. Not really an indication at this point for  2. Primarily in today due to intractable N/V 3. IVF: 3L NS given in ED, will saline lock overnight, and just get intake and output, re-eval in AM 4. Repeat CBC/CMP in AM 5. Tylenol PRN fever 6. Zofran PRN nausea 7. Try clear liquids  DVT prophylaxis: Lovenox Code Status: Full Family Communication: No family in room Disposition Plan: Home after admit Consults called: None Admission status: Place in 41   Adysson Revelle M. DO [facility]  How to contact the [facility] Attending or Consulting provider 7A - 7P or covering provider during after hours 7P -7A, for this patient?  1. Check the care team in [facility] and look for a) attending/consulting [facility] provider listed and b) the [facility] team listed 2. Log into  [email or web address]  Amion Physician Scheduling and messaging for groups and whole hospitals  On call and physician scheduling software for group practices, residents, hospitalists and other medical providers for call, clinic, rotation and shift schedules. OnCall Enterprise is a hospital-wide system for scheduling doctors and paging doctors on call. EasyPlot is for scientific plotting and data analysis.  [email or web address]  and use [facility]'s universal password to access. If you do not have the password, please contact the hospital operator.  3. Locate the [facility] provider you are looking for under [facility] and page to a number that you can be directly reached. 4. If you still have difficulty reaching the provider, please page the [facility] (Director on Call) for the Hospitalists listed on amion for assistance.  08/09/2018, 8:26 PM

## 2018-08-09 NOTE — ED Notes (Signed)
Spoke with lab able to add on lipase and Hepatitis to previously collected blood.

## 2018-08-09 NOTE — ED Provider Notes (Signed)
[facility]-EMERGENCY DEPT Provider Note   CSN: [id number] Arrival date & time: 08/09/18  0859    History   Chief Complaint Chief Complaint  Patient presents with   Weakness    COVID +    HPI Yvette [name] is a 48 y.o. female.     HPI Patient is a 48 year old female who presents the emergency department with complaints of nausea vomiting and inability to keep fluids down over the past 48 hours.  She denies focal abdominal pain.  Reports low-grade fevers.  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  She reports no significant chest pain or shortness of breath.  She feels like she is dehydrated and unable to keep fluids down.  She denies hematemesis.  No hematochezia.  No back or flank pain.  No urinary complaints.  Symptoms are moderate in severity   Past Medical History:  Diagnosis Date   Chronic constipation    IBS (irritable bowel syndrome)    Menorrhagia    OA (osteoarthritis)     Patient Active Problem List   Diagnosis Date Noted   Upper airway cough syndrome 06/24/2018   Menorrhagia 09/17/2015   GERD (gastroesophageal reflux disease) 10/14/2013   Other dysphagia 10/14/2013   Hemorrhoids 09/28/2013   Bloating 09/28/2013   Unspecified constipation 09/28/2013    Past Surgical History:  Procedure Laterality Date   BILATERAL SALPINGECTOMY Bilateral 09/17/2015   Procedure: BILATERAL SALPINGECTOMY;  Surgeon: Richarda Overlie, MD;  Location: [facility];  Service: Gynecology;  Laterality: Bilateral;   DIAGNOSTIC LAPAROSCOPY  10-12-2000   HYSTEROSCOPY W/D&C  summer 2016   LAPAROSCOPIC ASSISTED VAGINAL HYSTERECTOMY N/A 09/17/2015   Procedure: LAPAROSCOPIC ASSISTED VAGINAL HYSTERECTOMY;  Surgeon: Richarda Overlie, MD;  Location: [facility];  Service: Gynecology;  Laterality: N/A;     OB History    Gravida  4   Para  2   Term      Preterm      AB      Living  2     SAB      TAB      Ectopic      Multiple      Live Births               Home Medications    Prior to Admission medications   Medication Sig Start Date End Date Taking? Authorizing Provider  clonazePAM (KLONOPIN) 0.5 MG tablet Take 0.5 mg by mouth 2 (two) times daily.  02/22/18  Yes [provider]  fluticasone (FLONASE) 50 MCG/ACT nasal spray Place 2 sprays into both nostrils daily.   Yes [provider]  gabapentin (NEURONTIN) 100 MG capsule Take 1 capsule (100 mg total) by mouth 3 (three) times daily. One three times daily 06/24/18  Yes Nyoka Cowden, MD  ibuprofen (ADVIL,MOTRIN) 800 MG tablet Take 1 tablet (800 mg total) by mouth every 8 (eight) hours as needed for moderate pain (mild pain). 09/22/15  Yes Richarda Overlie, MD  Multiple Vitamin (MULTIVITAMIN WITH MINERALS) TABS tablet Take 1 tablet by mouth daily.   Yes [provider]  omeprazole (PRILOSEC) 40 MG capsule Take 1 capsule (40 mg total) by mouth every morning. Take 30-60 minutes before breakfast 04/13/18  Yes Meredith Pel, NP  polyethylene glycol (MIRALAX / GLYCOLAX) packet Take 17 g by mouth daily as needed for mild constipation.   Yes [provider]  ranitidine (ZANTAC) 150 MG tablet Take 1 tablet (  150 mg total) by mouth at bedtime. 04/13/18  Yes Meredith Pel, NP  TURMERIC PO Take by mouth.   Yes [provider]  predniSONE (DELTASONE) 10 MG tablet Take  4 each am x 2 days,   2 each am x 2 days,  1 each am x 2 days and stop Patient not taking: Reported on 08/09/2018 06/24/18   Nyoka Cowden, MD    Family History Family History  Problem Relation Age of Onset   Diabetes Maternal Aunt    Cirrhosis Maternal Uncle    Allergies Mother    Emphysema Paternal Aunt        smoked   Colon cancer Neg Hx    Pancreatic cancer Neg Hx     Social History Social History   Tobacco Use   Smoking status: Never Smoker   Smokeless tobacco: Never Used  Substance  Use Topics   Alcohol use: Yes    Comment: OCCASIONAL   Drug use: No     Allergies   Doxycycline and Morphine and related   Review of Systems Review of Systems  All other systems reviewed and are negative.    Physical Exam Updated Vital Signs BP 118/72    Pulse 84    Temp 99.4 F (37.4 C) (Oral)    Ht  (1.575 m)    Wt 74.4 kg    LMP 08/15/2015 (Approximate)    SpO2 100%    BMI 30.00 kg/m   Physical Exam Vitals signs and nursing note reviewed.  Constitutional:      Appearance: She is well-developed.  HENT:     Head: Normocephalic.  Neck:     Musculoskeletal: Normal range of motion.  Pulmonary:     Effort: Pulmonary effort is normal.  Abdominal:     General: There is no distension.     Tenderness: There is no abdominal tenderness.  Musculoskeletal: Normal range of motion.  Neurological:     Mental Status: She is alert and oriented to person, place, and time.      ED Treatments / Results  Labs (all labs ordered are listed, but only abnormal results are displayed) Labs Reviewed  SARS CORONAVIRUS 2 (HOSPITAL ORDER, PERFORMED IN [facility]) - Abnormal; Notable for the following components:      Result Value   SARS Coronavirus 2 POSITIVE (*)    All other components within normal limits  COMPREHENSIVE METABOLIC PANEL - Abnormal; Notable for the following components:   Glucose, Bld 117 (*)    BUN 5 (*)    Total Protein 8.2 (*)    AST 220 (*)    ALT 148 (*)    All other components within normal limits  LACTATE DEHYDROGENASE - Abnormal; Notable for the following components:   LDH 416 (*)    All other components within normal limits  FERRITIN - Abnormal; Notable for the following components:   Ferritin 2,439 (*)    All other components within normal limits  FIBRINOGEN - Abnormal; Notable for the following components:   Fibrinogen 641 (*)    All other components within normal limits  C-REACTIVE PROTEIN - Abnormal; Notable for the following  components:   CRP 7.9 (*)    All other components within normal limits  URINALYSIS, ROUTINE W REFLEX MICROSCOPIC - Abnormal; Notable for the following components:   Color, Urine STRAW (*)    All other components within normal limits  CULTURE, BLOOD (ROUTINE X 2)  CULTURE, BLOOD (ROUTINE X  2)  LACTIC ACID, PLASMA  CBC WITH DIFFERENTIAL/PLATELET  D-DIMER, QUANTITATIVE (NOT AT [facility])  PROCALCITONIN  TRIGLYCERIDES  LIPASE, BLOOD  HEPATITIS PANEL, ACUTE  I-STAT BETA HCG BLOOD, ED ([facility], [facility], [facility] ONLY)   ALT  Date Value Ref Range Status  08/09/2018 148 (H) 0 - 44 U/L Final  03/14/2017 9 (L) 14 - 54 U/L Final    AST  Date Value Ref Range Status  08/09/2018 220 (H) 15 - 41 U/L Final  03/14/2017 20 15 - 41 U/L Final    EKG EKG Interpretation  Date/Time:  Monday Aug 09 2018 09:49:16 EDT Ventricular Rate:  80 PR Interval:    QRS Duration: 90 QT Interval:  346 QTC Calculation: 400 R Axis:   75 Text Interpretation:  Sinus rhythm No significant change was found Confirmed by Azalia Bilisampos, Kymiah Araiza ([id number]) on 08/09/2018 10:28:36 AM   Radiology Dg Chest Port 1 View  Result Date: 08/09/2018 CLINICAL DATA:  48 year old female with increasing weakness EXAM: PORTABLE CHEST 1 VIEW COMPARISON:  12/30/2017, 05/31/2018 FINDINGS: Low lung volumes with linear opacities at the lung bases. Cardiomediastinal silhouette likely unchanged given the low lung volumes. No pleural effusion or pneumothorax. No confluent airspace disease. No displaced fracture. IMPRESSION: Low lung volumes with likely basilar atelectasis. Electronically Signed   By: Gilmer MorJaime  Wagner D.O.   On: 08/09/2018 10:16   [place] Abdomen Limited Ruq  Result Date: 08/09/2018 CLINICAL DATA:  Elevated LFTs. EXAM: ULTRASOUND ABDOMEN LIMITED RIGHT UPPER QUADRANT COMPARISON:  Ultrasound 12/30/2011 and chest CT 03/14/2017 FINDINGS: Gallbladder: No gallstones or wall thickening visualized. No sonographic Murphy sign noted by sonographer. Common bile duct:  Diameter: 3.5 mm. Liver: No focal lesion identified. Within normal limits in parenchymal echogenicity. Portal vein is patent on color Doppler imaging with normal direction of blood flow towards the liver. IMPRESSION: Normal right upper quadrant ultrasound. Electronically Signed   By: Elberta Fortisaniel  Boyle M.D.   On: 08/09/2018 14:48    Procedures Procedures (including critical care time)  Medications Ordered in ED Medications  sodium chloride 0.9 % bolus 1,000 mL (has no administration in time range)  sodium chloride 0.9 % bolus 1,000 mL (1,000 mLs Intravenous New Bag/Given 08/09/18 0945)     Initial Impression / Assessment and Plan / ED Course  I have reviewed the triage vital signs and the nursing notes.  Pertinent labs & imaging results that were available during my care of the patient were reviewed by me and considered in my medical decision making (see chart for details).        Acute elevation in her liver function test.  Acute hepatitis panel be sent.  This may be secondary to her coronavirus infection.  Given her nausea vomiting and inability to keep fluids down she will undergo right upper quadrant ultrasound as well.  3:11 PM Ultrasound demonstrates no right upper quadrant pathology.  Elevated liver function test likely secondary to known COVID-19 infections.  Oral trial here in the emergency department.  If she can keep oral fluids down she likely can be safely discharged home to follow-up either with GI or primary care physician for repeat liver function tests and someone to follow-up on the acute hepatitis panel.  If unable to keep fluids down she will need admission the hospital for ongoing IV hydration.  3:11 PM Care transferred to Dr. Rush Landmarkegeler  Final Clinical Impressions(s) / ED Diagnoses   Final diagnoses:  Nausea & vomiting  Elevated liver enzymes    ED Discharge Orders    None  Azalia Bilis, MD 08/09/18 1511

## 2018-08-09 NOTE — ED Notes (Signed)
Spoke with EDP Campos, ok to not draw second lactic acid.

## 2018-08-09 NOTE — ED Notes (Signed)
[facility]ed [facility] transfer from [facility] to [facility]

## 2018-08-09 NOTE — ED Notes (Addendum)
EDP at bedside  

## 2018-08-10 ENCOUNTER — Encounter (HOSPITAL_COMMUNITY): Payer: Self-pay

## 2018-08-10 DIAGNOSIS — U071 COVID-19: Secondary | ICD-10-CM | POA: Diagnosis not present

## 2018-08-10 DIAGNOSIS — J988 Other specified respiratory disorders: Secondary | ICD-10-CM | POA: Diagnosis not present

## 2018-08-10 DIAGNOSIS — R112 Nausea with vomiting, unspecified: Secondary | ICD-10-CM | POA: Diagnosis not present

## 2018-08-10 DIAGNOSIS — R748 Abnormal levels of other serum enzymes: Secondary | ICD-10-CM | POA: Diagnosis not present

## 2018-08-10 DIAGNOSIS — R7401 Elevation of levels of liver transaminase levels: Secondary | ICD-10-CM | POA: Diagnosis present

## 2018-08-10 LAB — CBC WITH DIFFERENTIAL/PLATELET
Abs Immature Granulocytes: 0.09 10*3/uL — ABNORMAL HIGH (ref 0.00–0.07)
Basophils Absolute: 0 10*3/uL (ref 0.0–0.1)
Basophils Relative: 1 %
Eosinophils Absolute: 0 10*3/uL (ref 0.0–0.5)
Eosinophils Relative: 0 %
HCT: 35.8 % — ABNORMAL LOW (ref 36.0–46.0)
Hemoglobin: 11.6 g/dL — ABNORMAL LOW (ref 12.0–15.0)
Immature Granulocytes: 2 %
Lymphocytes Relative: 33 %
Lymphs Abs: 1.6 10*3/uL (ref 0.7–4.0)
MCH: 29.7 pg (ref 26.0–34.0)
MCHC: 32.4 g/dL (ref 30.0–36.0)
MCV: 91.8 fL (ref 80.0–100.0)
Monocytes Absolute: 0.4 10*3/uL (ref 0.1–1.0)
Monocytes Relative: 9 %
Neutro Abs: 2.7 10*3/uL (ref 1.7–7.7)
Neutrophils Relative %: 55 %
Platelets: 379 10*3/uL (ref 150–400)
RBC: 3.9 MIL/uL (ref 3.87–5.11)
RDW: 13.6 % (ref 11.5–15.5)
WBC: 4.9 10*3/uL (ref 4.0–10.5)
nRBC: 0 % (ref 0.0–0.2)

## 2018-08-10 LAB — C-REACTIVE PROTEIN: CRP: 5.2 mg/dL — ABNORMAL HIGH (ref <1.0)

## 2018-08-10 LAB — HEPATITIS PANEL, ACUTE
HCV Ab: <0.1 s/co ratio (ref 0.0–0.9)
Hep A IgM: NEGATIVE
Hep B C IgM: NEGATIVE
Hepatitis B Surface Ag: NEGATIVE

## 2018-08-10 LAB — COMPREHENSIVE METABOLIC PANEL
ALT: 183 U/L — ABNORMAL HIGH (ref 0–44)
AST: 295 U/L — ABNORMAL HIGH (ref 15–41)
Albumin: 3.3 g/dL — ABNORMAL LOW (ref 3.5–5.0)
Alkaline Phosphatase: 102 U/L (ref 38–126)
Anion gap: 9 (ref 5–15)
BUN: 6 mg/dL (ref 6–20)
CO2: 25 mmol/L (ref 22–32)
Calcium: 8.6 mg/dL — ABNORMAL LOW (ref 8.9–10.3)
Chloride: 105 mmol/L (ref 98–111)
Creatinine, Ser: 0.69 mg/dL (ref 0.44–1.00)
GFR calc Af Amer: >60 mL/min (ref >60)
GFR calc non Af Amer: >60 mL/min (ref >60)
Glucose, Bld: 158 mg/dL — ABNORMAL HIGH (ref 70–99)
Potassium: 3.7 mmol/L (ref 3.5–5.1)
Sodium: 139 mmol/L (ref 135–145)
Total Bilirubin: 0.3 mg/dL (ref 0.3–1.2)
Total Protein: 7.4 g/dL (ref 6.5–8.1)

## 2018-08-10 MED ORDER — SENNOSIDES-DOCUSATE SODIUM 8.6-50 MG PO TABS
2.0000 | ORAL_TABLET | Freq: Every evening | ORAL | Status: DC | PRN
  Administered 2018-08-10: 2 via ORAL
  Filled 2018-08-10: qty 2

## 2018-08-10 MED ORDER — LIP MEDEX EX OINT
TOPICAL_OINTMENT | CUTANEOUS | Status: DC | PRN
  Filled 2018-08-10: qty 7

## 2018-08-10 MED ORDER — ACETAMINOPHEN 325 MG PO TABS
650.0000 mg | ORAL_TABLET | ORAL | Status: DC | PRN
  Administered 2018-08-10: 650 mg via ORAL
  Filled 2018-08-10 (×2): qty 2

## 2018-08-10 MED ORDER — PHENOL 1.4 % MT LIQD
1.0000 | OROMUCOSAL | Status: DC | PRN
  Filled 2018-08-10: qty 177

## 2018-08-10 MED ORDER — SALINE SPRAY 0.65 % NA SOLN
1.0000 | NASAL | Status: DC | PRN
  Administered 2018-08-10: 1 via NASAL
  Filled 2018-08-10: qty 44

## 2018-08-10 NOTE — Progress Notes (Signed)
48 year old female positive for COVID with 10 days of cough and body aches and shortness of breath although currently breathing comfortably on room air.  Responded well to IV fluids.  M monitor over the next 1 to 2 days with potential discharge home soon.  Stable.

## 2018-08-10 NOTE — Progress Notes (Addendum)
Pt has arrived from [facility] , ambulatory from stretcher to bed, ate a sandwich prior to arrival (1st food in 3 days) on Room air O2sats>97%, sleepy, no concerns

## 2018-08-10 NOTE — Progress Notes (Signed)
Per patient she would prefer to keep her mother updated herself. Asked patient if she would like me to call and speak with her mother. Patient declined at this time.

## 2018-08-10 NOTE — ED Notes (Signed)
Patient given a sandwich and ginger ale.  

## 2018-08-10 NOTE — ED Notes (Signed)
[facility] at bedside 

## 2018-08-10 NOTE — ED Notes (Signed)
Over the past 2 hours, pt had 16oz of ginger ale and didn't report nausea

## 2018-08-10 NOTE — Progress Notes (Signed)
PROGRESS NOTE  Yvette [name] ZOX:[id number] DOB: 07/30/1970 DOA: 08/09/2018  PCP: Georgianne Fickamachandran, Ajith, MD  Brief History/Interval Summary: 48 y.o. female with medical history significant of IBS and chronic constipation.  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  She has been ill for the past 10 days with cough, N/V, fever, body aches.  Cough and SOB seems to be worse at night  Reason for Visit: Intractable nausea and vomiting.  COVID-19 infection.  Consultants: None  Procedures: None  Antibiotics: None  Subjective/Interval History: Patient states that she had some nausea overnight but no vomiting since yesterday morning.  She is willing to try food today.  Denies any shortness of breath or cough.    Assessment/Plan:  Acute Covid 19 Viral Illness during the ongoing 2020 Covid 19 Pandemic  COVID-19 Labs  Recent Labs    08/09/18 0924  DDIMER 0.45  FERRITIN 2,439*  LDH 416*  CRP 7.9*    Lab Results  Component Value Date   SARSCOV2NAA POSITIVE (A) 08/09/2018     Last episode of fever: She has been afebrile during her current hospitalization so far. Oxygen requirements: On room air Antibiotics: None Steroids: None Diuretics: None Actemra: Not indicated yet Convalescent Plasma: Not indicated yet   She has respiratory status is actually quite stable.  Chest x-ray showed atelectasis.  Incentive spirometry.  She is predominantly experiencing GI symptoms.  Continue to mobilize.  Continue to monitor for now.  CRP noted to be 7.9.  We will repeat labs today.  0.1.  Intractable nausea and vomiting This was the primary reason for her presentation.  Likely due to viral syndrome.  Symptoms appear to have improved.  Patient denies any history of diabetes.  Continue to monitor.  Advance diet.  UA was unremarkable.  Lipase was normal.  Abdomen is benign.  Transaminitis Abnormal AST and ALT were noted.  Probably due to COVID-19  infection.  Abdomen remains benign.  Repeat LFTs ordered.  Hepatitis panel is negative.   DVT Prophylaxis:  Lovenox  Lab Results  Component Value Date   PLT 354 08/09/2018     PUD Prophylaxis: Protonix Code Status: Full code Family Communication: Discussed with the patient Disposition Plan: Management as outlined above   Medications:  Scheduled: . clonazePAM  0.5 mg Oral BID  . enoxaparin (LOVENOX) injection  40 mg Subcutaneous QHS  . famotidine  40 mg Oral QHS  . fluticasone  2 spray Each Nare Daily  . gabapentin  100 mg Oral TID  . multivitamin with minerals  1 tablet Oral Daily  . pantoprazole  80 mg Oral Daily   Continuous:  WJX:BJYNWGNFAPRN:albuterol, chlorpheniramine-HYDROcodone, guaiFENesin-dextromethorphan, ondansetron **OR** ondansetron (ZOFRAN) IV, phenol, polyethylene glycol, sodium chloride   Objective:  Vital Signs  Vitals:   08/10/18 0105 08/10/18 0113 08/10/18 0151 08/10/18 0800  BP:  122/76  131/77  Pulse:  86  77  Resp:  (!) 25  20  Temp: 98.3 F (36.8 C)  99.1 F (37.3 C) 98.2 F (36.8 C)  TempSrc: Oral  Oral Oral  SpO2:  100%  98%  Weight:      Height:        Intake/Output Summary (Last 24 hours) at 08/10/2018 1024 Last data filed at 08/10/2018 0445 Gross per 24 hour  Intake 1110 ml  Output 600 ml  Net 510 ml   Filed Weights   08/09/18 0957  Weight: 74.4 kg    General appearance: Awake alert.  In no distress Resp: Clear to auscultation bilaterally.  Normal effort Cardio: S1-S2 is normal regular.  No S3-S4.  No rubs murmurs or bruit GI: Abdomen is soft.  Nontender nondistended.  Bowel sounds are present normal.  No masses organomegaly Extremities: No edema.  Full range of motion of lower extremities. Neurologic: Alert and oriented x3.  No focal neurological deficits.    Lab Results:  Data Reviewed: I have personally reviewed following labs and imaging studies  CBC: Recent Labs  Lab 08/09/18 0924  WBC 6.9  NEUTROABS 5.0  HGB 12.6   HCT 38.0  MCV 90.9  PLT 354    Basic Metabolic Panel: Recent Labs  Lab 08/09/18 0924  NA 140  K 3.8  CL 102  CO2 28  GLUCOSE 117*  BUN 5*  CREATININE 0.60  CALCIUM 8.9    GFR: Estimated Creatinine Clearance: 82.1 mL/min (by C-G formula based on SCr of 0.6 mg/dL).  Liver Function Tests: Recent Labs  Lab 08/09/18 0924  AST 220*  ALT 148*  ALKPHOS 118  BILITOT 0.3  PROT 8.2*  ALBUMIN 3.6    Recent Labs  Lab 08/09/18 0946  LIPASE 51    Lipid Profile: Recent Labs    08/09/18 0924  TRIG 120    Anemia Panel: Recent Labs    08/09/18 2841  FERRITIN 2,439*    Recent Results (from the past 240 hour(s))  Blood Culture (routine x 2)     Status: None (Preliminary result)   Collection Time: 08/09/18  9:24 AM  Result Value Ref Range Status   Specimen Description   Final    BLOOD LEFT ANTECUBITAL Performed at [facility], [address] [place], [place] [address]    Special Requests   Final    BOTTLES DRAWN AEROBIC AND ANAEROBIC Blood Culture results may not be optimal due to an excessive volume of blood received in culture bottles Performed at [facility], [address], [place], [place] [address]    Culture PENDING  Incomplete   Report Status PENDING  Incomplete  Blood Culture (routine x 2)     Status: None (Preliminary result)   Collection Time: 08/09/18  9:29 AM  Result Value Ref Range Status   Specimen Description   Final    BLOOD RIGHT ANTECUBITAL Performed at [facility], [address], [place], [place] [address]    Special Requests   Final    BOTTLES DRAWN AEROBIC AND ANAEROBIC Blood Culture adequate volume Performed at [facility], [address], [place], [place] [address]    Culture PENDING  Incomplete   Report Status PENDING  Incomplete  SARS Coronavirus 2 [facility] order, Performed in [facility] hospital lab)     Status: Abnormal   Collection Time: 08/09/18  9:46 AM  Result Value  Ref Range Status   SARS Coronavirus 2 POSITIVE (A) NEGATIVE Final    Comment: RESULT CALLED TO, READ BACK BY AND VERIFIED WITH: K.HALL RN AT 1100 ON 08/09/2018 BY S.VANHOORNE (NOTE) If result is NEGATIVE SARS-CoV-2 target nucleic acids are NOT DETECTED. The SARS-CoV-2 RNA is generally detectable in upper and lower  respiratory specimens during the acute phase of infection. The lowest  concentration of SARS-CoV-2 viral copies this assay can detect is 250  copies / mL. A negative result does not preclude SARS-CoV-2 infection  and should not be used as the sole basis for treatment or other  patient management decisions.  A negative result may  occur with  improper specimen collection / handling, submission of specimen other  than nasopharyngeal swab, presence of viral mutation(s) within the  areas targeted by this assay, and inadequate number of viral copies  (<250 copies / mL). A negative result must be combined with clinical  observations, patient history, and epidemiological information. If result is POSITIVE SARS-CoV-2 target nucleic acids are DE TECTED. The SARS-CoV-2 RNA is generally detectable in upper and lower  respiratory specimens during the acute phase of infection.  Positive  results are indicative of active infection with SARS-CoV-2.  Clinical  correlation with patient history and other diagnostic information is  necessary to determine patient infection status.  Positive results do  not rule out bacterial infection or co-infection with other viruses. If result is PRESUMPTIVE POSTIVE SARS-CoV-2 nucleic acids MAY BE PRESENT.   A presumptive positive result was obtained on the submitted specimen  and confirmed on repeat testing.  While 2019 novel coronavirus  (SARS-CoV-2) nucleic acids may be present in the submitted sample  additional confirmatory testing may be necessary for epidemiological  and / or clinical management purposes  to differentiate between  SARS-CoV-2 and  other Sarbecovirus currently known to infect humans.  If clinically indicated additional testing with an alternate test  methodology (L [id number]) is advised. The SARS-CoV-2 RNA is generally  detectable in upper and lower respiratory specimens during the acute  phase of infection. The expected result is Negative. Fact Sheet for Patients:  BoilerBrush.com.cy Fact Sheet for Healthcare Providers: https://pope.com/ This test is not yet approved or cleared by the [place] FDA and has been authorized for detection and/or diagnosis of SARS-CoV-2 by FDA under an Emergency Use Authorization (EUA).  This EUA will remain in effect (meaning this test can be used) for the duration of the COVID-19 declaration under Section 564(b)(1) of the Act, 21 U.S.C. section 360bbb-3(b)(1), unless the authorization is terminated or revoked sooner. Performed at [facility], [address], [place], [place] [address]       Radiology Studies: Dg Chest Port 1 View  Result Date: 08/09/2018 CLINICAL DATA:  48 year old female with increasing weakness EXAM: PORTABLE CHEST 1 VIEW COMPARISON:  12/30/2017, 05/31/2018 FINDINGS: Low lung volumes with linear opacities at the lung bases. Cardiomediastinal silhouette likely unchanged given the low lung volumes. No pleural effusion or pneumothorax. No confluent airspace disease. No displaced fracture. IMPRESSION: Low lung volumes with likely basilar atelectasis. Electronically Signed   By: Gilmer Mor D.O.   On: 08/09/2018 10:16   US Abdomen Limited Ruq  Result Date: 08/09/2018 CLINICAL DATA:  Elevated LFTs. EXAM: ULTRASOUND ABDOMEN LIMITED RIGHT UPPER QUADRANT COMPARISON:  Ultrasound 12/30/2011 and chest CT 03/14/2017 FINDINGS: Gallbladder: No gallstones or wall thickening visualized. No sonographic Murphy sign noted by sonographer. Common bile duct: Diameter: 3.5 mm. Liver: No focal lesion identified. Within  normal limits in parenchymal echogenicity. Portal vein is patent on color Doppler imaging with normal direction of blood flow towards the liver. IMPRESSION: Normal right upper quadrant ultrasound. Electronically Signed   By: Elberta Fortis M.D.   On: 08/09/2018 14:48       LOS: 0 days   Zita Ozimek Rito Ehrlich  [facility] Pager on [email or web address]  08/10/2018, 10:24 AM

## 2018-08-11 DIAGNOSIS — E876 Hypokalemia: Secondary | ICD-10-CM | POA: Diagnosis present

## 2018-08-11 DIAGNOSIS — R112 Nausea with vomiting, unspecified: Secondary | ICD-10-CM | POA: Diagnosis present

## 2018-08-11 DIAGNOSIS — M1990 Unspecified osteoarthritis, unspecified site: Secondary | ICD-10-CM | POA: Diagnosis present

## 2018-08-11 DIAGNOSIS — K581 Irritable bowel syndrome with constipation: Secondary | ICD-10-CM | POA: Diagnosis present

## 2018-08-11 DIAGNOSIS — K219 Gastro-esophageal reflux disease without esophagitis: Secondary | ICD-10-CM | POA: Diagnosis present

## 2018-08-11 DIAGNOSIS — Z79899 Other long term (current) drug therapy: Secondary | ICD-10-CM | POA: Diagnosis not present

## 2018-08-11 DIAGNOSIS — Z7951 Long term (current) use of inhaled steroids: Secondary | ICD-10-CM | POA: Diagnosis not present

## 2018-08-11 DIAGNOSIS — R1110 Vomiting, unspecified: Secondary | ICD-10-CM | POA: Diagnosis present

## 2018-08-11 DIAGNOSIS — U071 COVID-19: Secondary | ICD-10-CM | POA: Diagnosis present

## 2018-08-11 DIAGNOSIS — Z90710 Acquired absence of both cervix and uterus: Secondary | ICD-10-CM | POA: Diagnosis not present

## 2018-08-11 DIAGNOSIS — Z885 Allergy status to narcotic agent status: Secondary | ICD-10-CM | POA: Diagnosis not present

## 2018-08-11 DIAGNOSIS — Z9079 Acquired absence of other genital organ(s): Secondary | ICD-10-CM | POA: Diagnosis not present

## 2018-08-11 DIAGNOSIS — K529 Noninfective gastroenteritis and colitis, unspecified: Secondary | ICD-10-CM | POA: Diagnosis present

## 2018-08-11 DIAGNOSIS — Z881 Allergy status to other antibiotic agents status: Secondary | ICD-10-CM | POA: Diagnosis not present

## 2018-08-11 DIAGNOSIS — J988 Other specified respiratory disorders: Secondary | ICD-10-CM | POA: Diagnosis not present

## 2018-08-11 DIAGNOSIS — E869 Volume depletion, unspecified: Secondary | ICD-10-CM | POA: Diagnosis present

## 2018-08-11 LAB — COMPREHENSIVE METABOLIC PANEL
ALT: 164 U/L — ABNORMAL HIGH (ref 0–44)
AST: 230 U/L — ABNORMAL HIGH (ref 15–41)
Albumin: 3.3 g/dL — ABNORMAL LOW (ref 3.5–5.0)
Alkaline Phosphatase: 105 U/L (ref 38–126)
Anion gap: 13 (ref 5–15)
BUN: 6 mg/dL (ref 6–20)
CO2: 26 mmol/L (ref 22–32)
Calcium: 8.6 mg/dL — ABNORMAL LOW (ref 8.9–10.3)
Chloride: 104 mmol/L (ref 98–111)
Creatinine, Ser: 0.7 mg/dL (ref 0.44–1.00)
GFR calc Af Amer: >60 mL/min (ref >60)
GFR calc non Af Amer: >60 mL/min (ref >60)
Glucose, Bld: 93 mg/dL (ref 70–99)
Potassium: 3.3 mmol/L — ABNORMAL LOW (ref 3.5–5.1)
Sodium: 143 mmol/L (ref 135–145)
Total Bilirubin: 0.2 mg/dL — ABNORMAL LOW (ref 0.3–1.2)
Total Protein: 7.3 g/dL (ref 6.5–8.1)

## 2018-08-11 LAB — C-REACTIVE PROTEIN: CRP: 3.5 mg/dL — ABNORMAL HIGH (ref <1.0)

## 2018-08-11 LAB — CBC WITH DIFFERENTIAL/PLATELET
Abs Immature Granulocytes: 0.12 10*3/uL — ABNORMAL HIGH (ref 0.00–0.07)
Basophils Absolute: 0 10*3/uL (ref 0.0–0.1)
Basophils Relative: 1 %
Eosinophils Absolute: 0 10*3/uL (ref 0.0–0.5)
Eosinophils Relative: 0 %
HCT: 34.3 % — ABNORMAL LOW (ref 36.0–46.0)
Hemoglobin: 10.9 g/dL — ABNORMAL LOW (ref 12.0–15.0)
Immature Granulocytes: 2 %
Lymphocytes Relative: 46 %
Lymphs Abs: 2.5 10*3/uL (ref 0.7–4.0)
MCH: 28.9 pg (ref 26.0–34.0)
MCHC: 31.8 g/dL (ref 30.0–36.0)
MCV: 91 fL (ref 80.0–100.0)
Monocytes Absolute: 0.6 10*3/uL (ref 0.1–1.0)
Monocytes Relative: 12 %
Neutro Abs: 2.1 10*3/uL (ref 1.7–7.7)
Neutrophils Relative %: 39 %
Platelets: 394 10*3/uL (ref 150–400)
RBC: 3.77 MIL/uL — ABNORMAL LOW (ref 3.87–5.11)
RDW: 13.6 % (ref 11.5–15.5)
WBC: 5.4 10*3/uL (ref 4.0–10.5)
nRBC: 0 % (ref 0.0–0.2)

## 2018-08-11 LAB — HIV ANTIBODY (ROUTINE TESTING W REFLEX): HIV Screen 4th Generation wRfx: NONREACTIVE

## 2018-08-11 MED ORDER — POTASSIUM CHLORIDE 10 MEQ/100ML IV SOLN
10.0000 meq | INTRAVENOUS | Status: AC
  Administered 2018-08-11 (×3): 10 meq via INTRAVENOUS
  Filled 2018-08-11 (×3): qty 100

## 2018-08-11 MED ORDER — POTASSIUM CHLORIDE 10 MEQ/100ML IV SOLN: 10.0000 meq | INTRAVENOUS | Status: DC

## 2018-08-11 MED ORDER — METOCLOPRAMIDE HCL 5 MG/ML IJ SOLN
5.0000 mg | Freq: Three times a day (TID) | INTRAMUSCULAR | Status: DC
  Administered 2018-08-11 – 2018-08-12 (×4): 5 mg via INTRAVENOUS
  Filled 2018-08-11 (×10): qty 1

## 2018-08-11 NOTE — Progress Notes (Signed)
Per patient she will update her mom. She sates that she would prefer me not to call her.

## 2018-08-11 NOTE — Progress Notes (Signed)
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 She had been ill for 10 days with cough, N/V, fever, body aches when she presented to the ED for evaluation where she was confirmed COVID +. She was not hypoxic, but required admission due to intractable N/V w/ significant volume depletion.   Significant Events: 5/11 admit   COVID-19 specific Treatment: None  Subjective: The patient reports that she is still experiencing intractable nausea and was unable to consume solid food today.  She denies chest pain or diarrhea and in fact reports ongoing constipation.  She has a hacking dry cough and feels short of breath frequently in the evenings.  Assessment & Plan:  SARS CoV-2 Postive  No significant desaturation at present  Intractable N/V Likely to GI expression of above - UA unremarkable - lipase normal - abdom benign on exam - give trial of scheduled Reglan and follow  Hypokalemia Due to absent oral intake -supplement and follow -check magnesium in a.m.-  Transaminitis  Likely due to GI expression of CoV-2 - viral hepatitis panel negative -follow trend  DVT prophylaxis: lovenox  Code Status: FULL CODE Family Communication: pt requests no calls to family   Disposition Plan: Discharge home as soon as able to consistently tolerate adequate volume of clear liquids -possibly within next 24 hours  Consultants:  none  Antimicrobials:  None    Objective: Blood pressure 120/78, pulse 73, temperature 98.7 F (37.1 C), temperature source Oral, resp. rate 13, height 5\' 2"  (1.575 m), weight 74.4 kg, last menstrual period 08/15/2015, SpO2 99 %.  Intake/Output Summary (Last 24 hours) at 08/11/2018 0818 Last data filed at 08/10/2018 2142 Gross per 24 hour  Intake 120 ml   Output -  Net 120 ml   Filed Weights   08/09/18 0957  Weight: 74.4 kg    Examination: General: No acute respiratory distress Lungs: Clear to auscultation bilaterally without wheezes or crackles Cardiovascular: Regular rate and rhythm without murmur gallop or rub normal S1 and S2 Abdomen: Nontender, nondistended, soft, bowel sounds positive, no rebound, no ascites, no appreciable mass Extremities: No significant cyanosis, clubbing, or edema bilateral lower extremities  CBC: Recent Labs  Lab 08/09/18 0924 08/10/18 1300  WBC 6.9 4.9  NEUTROABS 5.0 2.7  HGB 12.6 11.6*  HCT 38.0 35.8*  MCV 90.9 91.8  PLT 354 379   Basic Metabolic Panel: Recent Labs  Lab 08/09/18 0924 08/10/18 1300  NA 140 139  K 3.8 3.7  CL 102 105  CO2 28 25  GLUCOSE 117* 158*  BUN 5* 6  CREATININE 0.60 0.69  CALCIUM 8.9 8.6*   GFR: Estimated Creatinine Clearance: 82.1 mL/min (by C-G formula based on SCr of 0.69 mg/dL).  Liver Function Tests: Recent Labs  Lab 08/09/18 0924 08/10/18 1300  AST 220* 295*  ALT 148* 183*  ALKPHOS 118 102  BILITOT 0.3 0.3  PROT 8.2* 7.4  ALBUMIN 3.6 3.3*   Recent Labs  Lab 08/09/18 0946  LIPASE 51    Recent Results (from the past 240 hour(s))  Blood Culture (routine x 2)     Status: None (Preliminary result)   Collection Time: 08/09/18  9:24 AM  Result Value Ref Range Status   Specimen Description   Final    BLOOD LEFT ANTECUBITAL Performed  at [facility], [address] [place], [place] [address]    Special Requests   Final    BOTTLES DRAWN AEROBIC AND ANAEROBIC Blood Culture results may not be optimal due to an excessive volume of blood received in culture bottles Performed at [facility], [address] [place], [place] [address]    Culture   Final    NO GROWTH < 24 HOURS Performed at [facility], [address] [place], [place] [address]    Report Status PENDING  Incomplete  Blood Culture (routine x 2)      Status: None (Preliminary result)   Collection Time: 08/09/18  9:29 AM  Result Value Ref Range Status   Specimen Description   Final    BLOOD RIGHT ANTECUBITAL Performed at [facility], [address] [place], [place] [address]    Special Requests   Final    BOTTLES DRAWN AEROBIC AND ANAEROBIC Blood Culture adequate volume Performed at [facility], [address] [place], [place] [address]    Culture   Final    NO GROWTH < 24 HOURS Performed at [facility], [address] [place], [place] [address]    Report Status PENDING  Incomplete  SARS Coronavirus 2 [facility] order, Performed in [facility] hospital lab)     Status: Abnormal   Collection Time: 08/09/18  9:46 AM  Result Value Ref Range Status   SARS Coronavirus 2 POSITIVE (A) NEGATIVE Final    Comment: RESULT CALLED TO, READ BACK BY AND VERIFIED WITH: K.HALL RN AT 1100 ON 08/09/2018 BY S.VANHOORNE (NOTE) If result is NEGATIVE SARS-CoV-2 target nucleic acids are NOT DETECTED. The SARS-CoV-2 RNA is generally detectable in upper and lower  respiratory specimens during the acute phase of infection. The lowest  concentration of SARS-CoV-2 viral copies this assay can detect is 250  copies / mL. A negative result does not preclude SARS-CoV-2 infection  and should not be used as the sole basis for treatment or other  patient management decisions.  A negative result may occur with  improper specimen collection / handling, submission of specimen other  than nasopharyngeal swab, presence of viral mutation(s) within the  areas targeted by this assay, and inadequate number of viral copies  (<250 copies / mL). A negative result must be combined with clinical  observations, patient history, and epidemiological information. If result is POSITIVE SARS-CoV-2 target nucleic acids are DE TECTED. The SARS-CoV-2 RNA is generally detectable in upper and lower  respiratory specimens during the acute phase of  infection.  Positive  results are indicative of active infection with SARS-CoV-2.  Clinical  correlation with patient history and other diagnostic information is  necessary to determine patient infection status.  Positive results do  not rule out bacterial infection or co-infection with other viruses. If result is PRESUMPTIVE POSTIVE SARS-CoV-2 nucleic acids MAY BE PRESENT.   A presumptive positive result was obtained on the submitted specimen  and confirmed on repeat testing.  While 2019 novel coronavirus  (SARS-CoV-2) nucleic acids may be present in the submitted sample  additional confirmatory testing may be necessary for epidemiological  and / or clinical management purposes  to differentiate between  SARS-CoV-2 and other Sarbecovirus currently known to infect humans.  If clinically indicated additional testing with an alternate test  methodology (L [id number]) is advised. The SARS-CoV-2 RNA is generally  detectable in upper and lower respiratory specimens during the acute  phase  of infection. The expected result is Negative. Fact Sheet for Patients:  BoilerBrush.com.cy Fact Sheet for Healthcare Providers: https://pope.com/ This test is not yet approved or cleared by the [place] FDA and has been authorized for detection and/or diagnosis of SARS-CoV-2 by FDA under an Emergency Use Authorization (EUA).  This EUA will remain in effect (meaning this test can be used) for the duration of the COVID-19 declaration under Section 564(b)(1) of the Act, 21 U.S.C. section 360bbb-3(b)(1), unless the authorization is terminated or revoked sooner. Performed at [facility], [address] [place], [place] [address]      Scheduled Meds: . clonazePAM  0.5 mg Oral BID  . enoxaparin (LOVENOX) injection  40 mg Subcutaneous QHS  . famotidine  40 mg Oral QHS  . fluticasone  2 spray Each Nare Daily  . gabapentin  100 mg Oral TID   . multivitamin with minerals  1 tablet Oral Daily  . pantoprazole  80 mg Oral Daily     LOS: 0 days   Lonia Blood, MD [facility] Office  [phone] Pager - Text Page per Amion  If 7PM-7AM, please contact night-coverage per Amion 08/11/2018, 8:18 AM

## 2018-08-11 NOTE — Progress Notes (Signed)
[facility] Department and AHF Team  ReDS Research Project   Patient #: [id number]  ReDS Measurement  Right: low quality x 2  Left: 18 %

## 2018-08-12 LAB — CBC WITH DIFFERENTIAL/PLATELET
Abs Immature Granulocytes: 0.11 10*3/uL — ABNORMAL HIGH (ref 0.00–0.07)
Basophils Absolute: 0 10*3/uL (ref 0.0–0.1)
Basophils Relative: 0 %
Eosinophils Absolute: 0 10*3/uL (ref 0.0–0.5)
Eosinophils Relative: 1 %
HCT: 33.6 % — ABNORMAL LOW (ref 36.0–46.0)
Hemoglobin: 11.1 g/dL — ABNORMAL LOW (ref 12.0–15.0)
Immature Granulocytes: 2 %
Lymphocytes Relative: 39 %
Lymphs Abs: 2.1 10*3/uL (ref 0.7–4.0)
MCH: 30 pg (ref 26.0–34.0)
MCHC: 33 g/dL (ref 30.0–36.0)
MCV: 90.8 fL (ref 80.0–100.0)
Monocytes Absolute: 0.7 10*3/uL (ref 0.1–1.0)
Monocytes Relative: 13 %
Neutro Abs: 2.5 10*3/uL (ref 1.7–7.7)
Neutrophils Relative %: 45 %
Platelets: 435 10*3/uL — ABNORMAL HIGH (ref 150–400)
RBC: 3.7 MIL/uL — ABNORMAL LOW (ref 3.87–5.11)
RDW: 13.6 % (ref 11.5–15.5)
WBC: 5.4 10*3/uL (ref 4.0–10.5)
nRBC: 0 % (ref 0.0–0.2)

## 2018-08-12 LAB — MAGNESIUM: Magnesium: 2.2 mg/dL (ref 1.7–2.4)

## 2018-08-12 LAB — COMPREHENSIVE METABOLIC PANEL
ALT: 129 U/L — ABNORMAL HIGH (ref 0–44)
AST: 136 U/L — ABNORMAL HIGH (ref 15–41)
Albumin: 3 g/dL — ABNORMAL LOW (ref 3.5–5.0)
Alkaline Phosphatase: 100 U/L (ref 38–126)
Anion gap: 7 (ref 5–15)
BUN: 6 mg/dL (ref 6–20)
CO2: 28 mmol/L (ref 22–32)
Calcium: 8.4 mg/dL — ABNORMAL LOW (ref 8.9–10.3)
Chloride: 107 mmol/L (ref 98–111)
Creatinine, Ser: 0.7 mg/dL (ref 0.44–1.00)
GFR calc Af Amer: >60 mL/min (ref >60)
GFR calc non Af Amer: >60 mL/min (ref >60)
Glucose, Bld: 91 mg/dL (ref 70–99)
Potassium: 3.6 mmol/L (ref 3.5–5.1)
Sodium: 142 mmol/L (ref 135–145)
Total Bilirubin: 0.5 mg/dL (ref 0.3–1.2)
Total Protein: 7 g/dL (ref 6.5–8.1)

## 2018-08-12 LAB — C-REACTIVE PROTEIN: CRP: 1.6 mg/dL — ABNORMAL HIGH (ref <1.0)

## 2018-08-12 LAB — FERRITIN: Ferritin: 1947 ng/mL — ABNORMAL HIGH (ref 11–307)

## 2018-08-12 LAB — D-DIMER, QUANTITATIVE: D-Dimer, Quant: 0.55 ug/mL-FEU — ABNORMAL HIGH (ref 0.00–0.50)

## 2018-08-12 NOTE — Progress Notes (Signed)
[facility] Department and AHF Team  ReDS Research Project   Patient #: [id number]  ReDS Measurement  Right: 24 %  Left: 24 %

## 2018-08-12 NOTE — Progress Notes (Addendum)
The pt's Mother was called for a daily update, but there was no answer. I will try again later.   Malachi Bonds, the pt's Mother, returned my call and was given a daily update regarding the pt's current status.

## 2018-08-12 NOTE — Progress Notes (Signed)
PROGRESS NOTE                                                                                                                                                                                                             Patient Demographics:    Yvette [name], is a 48 y.o. female, DOB - 01/14/1971, ZOX:[id number]  Outpatient Primary MD for the patient is Georgianne Fickamachandran, Ajith, MD    LOS - 1  Chief Complaint  Patient presents with  . Weakness    COVID +       Brief Narrative  47yo w/ IBSand chronic constipation who works at a [employer] a large number of patients have tested positive for COVID. She had been ill for 10 days with cough, N/V, fever, body aches when she presented to the ED for evaluation where she was confirmed COVID +. She was not hypoxic, but required admission due to intractable N/V w/ significant volume depletion.   Subjective:    Yvette [name] today has, No headache, No chest pain, No abdominal pain - No Nausea, No new weakness tingling or numbness, No Cough - SOB.     Assessment  & Plan :     1. Acute Covid 19 Viral Illness during the ongoing 2020 Covid 19 Pandemic primary gastroenteritis-like symptoms-2 no hypoxia at rest, mild shortness of breath on exertion, monitor inflammatory markers, appears stable, GI symptoms have resolved.  Advance activity.  If stable discharge in 1 to 2 days.  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  COVID-19 Labs  Recent Labs    08/10/18 1300 08/11/18 0500 08/12/18 0519  FERRITIN  --   --  1,947*  CRP 5.2* 3.5* 1.6*    Lab Results  Component Value Date   SARSCOV2NAA POSITIVE (A) 08/09/2018     2.  Mildly elevated LFTs - trend is stable, elevation due to COVID-19 infection.  Right upper quadrant ultrasound stable.  3.  Hypokalemia.  Replaced and stable.    Condition - Fair  Family Communication  :  None  Code Status :  Full  Diet : Regular   Disposition Plan  :  Home in 1-2 days  Consults  :  None  Procedures  :    RUQ US - stable  PUD Prophylaxis :   DVT Prophylaxis  :  Lovenox  Lab Results  Component Value Date   PLT 435 (H) 08/12/2018    Inpatient Medications  Scheduled Meds: . clonazePAM  0.5 mg Oral BID  . enoxaparin (LOVENOX) injection  40 mg Subcutaneous QHS  . famotidine  40 mg Oral QHS  . fluticasone  2 spray Each Nare Daily  . gabapentin  100 mg Oral TID  . metoCLOPramide (REGLAN) injection  5 mg Intravenous Q8H  . multivitamin with minerals  1 tablet Oral Daily  . pantoprazole  80 mg Oral Daily   Continuous Infusions: PRN Meds:.acetaminophen, albuterol, chlorpheniramine-HYDROcodone, guaiFENesin-dextromethorphan, lip balm, ondansetron **OR** ondansetron (ZOFRAN) IV, phenol, polyethylene glycol, senna-docusate, sodium chloride  Antibiotics  :    Anti-infectives (From admission, onward)   None       Time Spent in minutes  30   Susa Raring M.D on 08/12/2018 at 9:52 AM  To page go to [email or web address] - password [email or web address]  [facility] -  Office  [phone]   See all Orders from today for further details   Admit date - 08/09/2018    1    Objective:   Vitals:   08/11/18 2012 08/12/18 0033 08/12/18 0524 08/12/18 0900  BP: 123/83 129/80 116/67   Pulse: 77 72  81  Resp: Temp: 98.2 F (36.8 C) 98.5 F (36.9 C) 98.2 F (36.8 C)   TempSrc: Oral Oral Oral   SpO2: 100% 100% 100% 100%  Weight:      Height:        Wt Readings from Last 3 Encounters:  08/09/18 74.4 kg  07/30/18 74.4 kg  06/24/18 74.3 kg     Intake/Output Summary (Last 24 hours) at 08/12/2018 0952 Last data filed at 08/12/2018 0900 Gross per 24 hour  Intake 480 ml  Output -  Net 480 ml     Physical Exam  Awake Alert, Oriented X 3, No new F.N deficits, Normal affect [place].AT,PERRAL Supple Neck,No JVD, No cervical lymphadenopathy appriciated.  Symmetrical Chest wall movement, Good air movement  bilaterally, CTAB RRR,No Gallops,Rubs or new Murmurs, No Parasternal Heave +ve B.Sounds, Abd Soft, No tenderness, No organomegaly appriciated, No rebound - guarding or rigidity. No Cyanosis, Clubbing or edema, No new Rash or bruise       Data Review:    CBC Recent Labs  Lab 08/09/18 0924 08/10/18 1300 08/11/18 0500 08/12/18 0519  WBC 6.9 4.9 5.4 5.4  HGB 12.6 11.6* 10.9* 11.1*  HCT 38.0 35.8* 34.3* 33.6*  PLT 354 379 394 435*  MCV 90.9 91.8 91.0 90.8  MCH 30.1 29.7 28.9 30.0  MCHC 33.2 32.4 31.8 33.0  RDW 13.4 13.6 13.6 13.6  LYMPHSABS 1.3 1.6 2.5 2.1  MONOABS 0.5 0.4 0.6 0.7  EOSABS 0.0 0.0 0.0 0.0  BASOSABS 0.0 0.0 0.0 0.0    Chemistries  Recent Labs  Lab 08/09/18 0924 08/10/18 1300 08/11/18 0500 08/12/18 0519  NA 140 139 143 142  K 3.8 3.7 3.3* 3.6  CL 102 105 104 107  CO2 GLUCOSE 117* 158* 93 91  BUN 5* CREATININE 0.60 0.69 0.70 0.70  CALCIUM 8.9 8.6* 8.6* 8.4*  MG  --   --   --  2.2  AST 220* 295* 230* 136*  ALT 148* 183* 164* 129*  ALKPHOS 118 102 105 100  BILITOT 0.3 0.3 0.2* 0.5   ------------------------------------------------------------------------------------------------------------------ No results for input(s): CHOL, HDL, LDLCALC, TRIG, CHOLHDL, LDLDIRECT in the last 72 hours.  No  results found for: HGBA1C ------------------------------------------------------------------------------------------------------------------ No results for input(s): TSH, T4TOTAL, T3FREE, THYROIDAB in the last 72 hours.  Invalid input(s): FREET3  Cardiac Enzymes No results for input(s): CKMB, TROPONINI, MYOGLOBIN in the last 168 hours.  Invalid input(s): CK ------------------------------------------------------------------------------------------------------------------ No results found for: BNP  Micro Results Recent Results (from the past 240 hour(s))  Blood Culture (routine x 2)     Status: None (Preliminary result)   Collection Time:  08/09/18  9:24 AM  Result Value Ref Range Status   Specimen Description   Final    BLOOD LEFT ANTECUBITAL Performed at [facility], [address], [place], [place] [address]    Special Requests   Final    BOTTLES DRAWN AEROBIC AND ANAEROBIC Blood Culture results may not be optimal due to an excessive volume of blood received in culture bottles Performed at [facility], [address] [place], [place] [address]    Culture   Final    NO GROWTH 2 DAYS Performed at [facility], [address], [place], [place] [address]    Report Status PENDING  Incomplete  Blood Culture (routine x 2)     Status: None (Preliminary result)   Collection Time: 08/09/18  9:29 AM  Result Value Ref Range Status   Specimen Description   Final    BLOOD RIGHT ANTECUBITAL Performed at [facility], [address] [place], [place] [address]    Special Requests   Final    BOTTLES DRAWN AEROBIC AND ANAEROBIC Blood Culture adequate volume Performed at [facility], [address], [place], [place] [address]    Culture   Final    NO GROWTH 2 DAYS Performed at [facility], [address] [place], [place] [address]    Report Status PENDING  Incomplete  SARS Coronavirus 2 [facility] order, Performed in [facility] hospital lab)     Status: Abnormal   Collection Time: 08/09/18  9:46 AM  Result Value Ref Range Status   SARS Coronavirus 2 POSITIVE (A) NEGATIVE Final    Comment: RESULT CALLED TO, READ BACK BY AND VERIFIED WITH: K.HALL RN AT 1100 ON 08/09/2018 BY S.VANHOORNE (NOTE) If result is NEGATIVE SARS-CoV-2 target nucleic acids are NOT DETECTED. The SARS-CoV-2 RNA is generally detectable in upper and lower  respiratory specimens during the acute phase of infection. The lowest  concentration of SARS-CoV-2 viral copies this assay can detect is 250  copies / mL. A negative result does not preclude SARS-CoV-2 infection  and should not be used as  the sole basis for treatment or other  patient management decisions.  A negative result may occur with  improper specimen collection / handling, submission of specimen other  than nasopharyngeal swab, presence of viral mutation(s) within the  areas targeted by this assay, and inadequate number of viral copies  (<250 copies / mL). A negative result must be combined with clinical  observations, patient history, and epidemiological information. If result is POSITIVE SARS-CoV-2 target nucleic acids are DE TECTED. The SARS-CoV-2 RNA is generally detectable in upper and lower  respiratory specimens during the acute phase of infection.  Positive  results are indicative of active infection with SARS-CoV-2.  Clinical  correlation with patient history and other diagnostic information is  necessary to determine patient infection status.  Positive results do  not rule out bacterial infection or co-infection with other viruses. If result is PRESUMPTIVE POSTIVE SARS-CoV-2 nucleic acids MAY BE PRESENT.  A presumptive positive result was obtained on the submitted specimen  and confirmed on repeat testing.  While 2019 novel coronavirus  (SARS-CoV-2) nucleic acids may be present in the submitted sample  additional confirmatory testing may be necessary for epidemiological  and / or clinical management purposes  to differentiate between  SARS-CoV-2 and other Sarbecovirus currently known to infect humans.  If clinically indicated additional testing with an alternate test  methodology (L [id number]) is advised. The SARS-CoV-2 RNA is generally  detectable in upper and lower respiratory specimens during the acute  phase of infection. The expected result is Negative. Fact Sheet for Patients:  BoilerBrush.com.cy Fact Sheet for Healthcare Providers: https://pope.com/ This test is not yet approved or cleared by the [place] FDA and has been authorized for  detection and/or diagnosis of SARS-CoV-2 by FDA under an Emergency Use Authorization (EUA).  This EUA will remain in effect (meaning this test can be used) for the duration of the COVID-19 declaration under Section 564(b)(1) of the Act, 21 U.S.C. section 360bbb-3(b)(1), unless the authorization is terminated or revoked sooner. Performed at [facility], [address], [place], [place] [address]     Radiology Reports Dg Chest [place] 1 View  Result Date: 08/09/2018 CLINICAL DATA:  48 year old female with increasing weakness EXAM: PORTABLE CHEST 1 VIEW COMPARISON:  12/30/2017, 05/31/2018 FINDINGS: Low lung volumes with linear opacities at the lung bases. Cardiomediastinal silhouette likely unchanged given the low lung volumes. No pleural effusion or pneumothorax. No confluent airspace disease. No displaced fracture. IMPRESSION: Low lung volumes with likely basilar atelectasis. Electronically Signed   By: Gilmer Mor D.O.   On: 08/09/2018 10:16   US Abdomen Limited Ruq  Result Date: 08/09/2018 CLINICAL DATA:  Elevated LFTs. EXAM: ULTRASOUND ABDOMEN LIMITED RIGHT UPPER QUADRANT COMPARISON:  Ultrasound 12/30/2011 and chest CT 03/14/2017 FINDINGS: Gallbladder: No gallstones or wall thickening visualized. No sonographic Murphy sign noted by sonographer. Common bile duct: Diameter: 3.5 mm. Liver: No focal lesion identified. Within normal limits in parenchymal echogenicity. Portal vein is patent on color Doppler imaging with normal direction of blood flow towards the liver. IMPRESSION: Normal right upper quadrant ultrasound. Electronically Signed   By: Elberta Fortis M.D.   On: 08/09/2018 14:48

## 2018-08-13 LAB — COMPREHENSIVE METABOLIC PANEL
ALT: 109 U/L — ABNORMAL HIGH (ref 0–44)
AST: 110 U/L — ABNORMAL HIGH (ref 15–41)
Albumin: 3.3 g/dL — ABNORMAL LOW (ref 3.5–5.0)
Alkaline Phosphatase: 99 U/L (ref 38–126)
Anion gap: 7 (ref 5–15)
BUN: 5 mg/dL — ABNORMAL LOW (ref 6–20)
CO2: 28 mmol/L (ref 22–32)
Calcium: 8.5 mg/dL — ABNORMAL LOW (ref 8.9–10.3)
Chloride: 106 mmol/L (ref 98–111)
Creatinine, Ser: 0.68 mg/dL (ref 0.44–1.00)
GFR calc Af Amer: >60 mL/min (ref >60)
GFR calc non Af Amer: >60 mL/min (ref >60)
Glucose, Bld: 94 mg/dL (ref 70–99)
Potassium: 3.6 mmol/L (ref 3.5–5.1)
Sodium: 141 mmol/L (ref 135–145)
Total Bilirubin: 0.6 mg/dL (ref 0.3–1.2)
Total Protein: 7.3 g/dL (ref 6.5–8.1)

## 2018-08-13 LAB — CBC WITH DIFFERENTIAL/PLATELET
Abs Immature Granulocytes: 0.18 10*3/uL — ABNORMAL HIGH (ref 0.00–0.07)
Basophils Absolute: 0 10*3/uL (ref 0.0–0.1)
Basophils Relative: 1 %
Eosinophils Absolute: 0 10*3/uL (ref 0.0–0.5)
Eosinophils Relative: 0 %
HCT: 32.7 % — ABNORMAL LOW (ref 36.0–46.0)
Hemoglobin: 10.7 g/dL — ABNORMAL LOW (ref 12.0–15.0)
Immature Granulocytes: 4 %
Lymphocytes Relative: 41 %
Lymphs Abs: 2.1 10*3/uL (ref 0.7–4.0)
MCH: 29.6 pg (ref 26.0–34.0)
MCHC: 32.7 g/dL (ref 30.0–36.0)
MCV: 90.3 fL (ref 80.0–100.0)
Monocytes Absolute: 0.7 10*3/uL (ref 0.1–1.0)
Monocytes Relative: 14 %
Neutro Abs: 2.1 10*3/uL (ref 1.7–7.7)
Neutrophils Relative %: 40 %
Platelets: 461 10*3/uL — ABNORMAL HIGH (ref 150–400)
RBC: 3.62 MIL/uL — ABNORMAL LOW (ref 3.87–5.11)
RDW: 13.4 % (ref 11.5–15.5)
WBC: 5.1 10*3/uL (ref 4.0–10.5)
nRBC: 0.4 % — ABNORMAL HIGH (ref 0.0–0.2)

## 2018-08-13 LAB — D-DIMER, QUANTITATIVE: D-Dimer, Quant: 0.41 ug/mL-FEU (ref 0.00–0.50)

## 2018-08-13 LAB — MAGNESIUM: Magnesium: 2.3 mg/dL (ref 1.7–2.4)

## 2018-08-13 LAB — FERRITIN: Ferritin: 1452 ng/mL — ABNORMAL HIGH (ref 11–307)

## 2018-08-13 LAB — C-REACTIVE PROTEIN: CRP: 0.9 mg/dL (ref <1.0)

## 2018-08-13 NOTE — Discharge Instructions (Signed)
Follow with Primary MD Georgianne Fick, MD in 7 days   Get CBC, CMP, 2 view Chest X ray -  checked  by Primary MD  in 5-7 days   Activity: As tolerated with Full fall precautions use walker/cane & assistance as needed  Disposition Home    Diet: Heart Healthy     Special Instructions: If you have smoked or chewed Tobacco  in the last 2 yrs please stop smoking, stop any regular Alcohol  and or any Recreational drug use.  On your next visit with your primary care physician please Get Medicines reviewed and adjusted.  Please request your Prim.MD to go over all Hospital Tests and Procedure/Radiological results at the follow up, please get all Hospital records sent to your Prim MD by signing hospital release before you go home.  If you experience worsening of your admission symptoms, develop shortness of breath, life threatening emergency, suicidal or homicidal thoughts you must seek medical attention immediately by calling 911 or calling your MD immediately  if symptoms less severe.  You Must read complete instructions/literature along with all the possible adverse reactions/side effects for all the Medicines you take and that have been prescribed to you. Take any new Medicines after you have completely understood and accpet all the possible adverse reactions/side effects.       Person Under Monitoring Name: Yvette [name]  Location: [address] [place] [address]   Infection Prevention Recommendations for Individuals Confirmed to have, or Being Evaluated for, 2019 Novel Coronavirus (COVID-19) Infection Who Receive Care at Home  Individuals who are confirmed to have, or are being evaluated for, COVID-19 should follow the prevention steps below until a healthcare provider or local or state health department says they can return to normal activities.  Stay home except to get medical care You should restrict activities outside your home, except for getting medical  care. Do not go to work, school, or public areas, and do not use public transportation or taxis.  Call ahead before visiting your doctor Before your medical appointment, call the healthcare provider and tell them that you have, or are being evaluated for, COVID-19 infection. This will help the healthcare providers office take steps to keep other people from getting infected. Ask your healthcare provider to call the local or state health department.  Monitor your symptoms Seek prompt medical attention if your illness is worsening (e.g., difficulty breathing). Before going to your medical appointment, call the healthcare provider and tell them that you have, or are being evaluated for, COVID-19 infection. Ask your healthcare provider to call the local or state health department.  Wear a facemask You should wear a facemask that covers your nose and mouth when you are in the same room with other people and when you visit a healthcare provider. People who live with or visit you should also wear a facemask while they are in the same room with you.  Separate yourself from other people in your home As much as possible, you should stay in a different room from other people in your home. Also, you should use a separate bathroom, if available.  Avoid sharing household items You should not share dishes, drinking glasses, cups, eating utensils, towels, bedding, or other items with other people in your home. After using these items, you should wash them thoroughly with soap and water.  Cover your coughs and sneezes Cover your mouth and nose with a tissue when you cough or sneeze, or you can  cough or sneeze into your sleeve. Throw used tissues in a lined trash can, and immediately wash your hands with soap and water for at least 20 seconds or use an alcohol-based hand rub.  Wash your [facility] your hands often and thoroughly with soap and water for at least 20 seconds. You can use an alcohol-based  hand sanitizer if soap and water are not available and if your hands are not visibly dirty. Avoid touching your eyes, nose, and mouth with unwashed hands.   Prevention Steps for Caregivers and Household Members of Individuals Confirmed to have, or Being Evaluated for, COVID-19 Infection Being Cared for in the Home  If you live with, or provide care at home for, a person confirmed to have, or being evaluated for, COVID-19 infection please follow these guidelines to prevent infection:  Follow healthcare providers instructions Make sure that you understand and can help the patient follow any healthcare provider instructions for all care.  Provide for the patients basic needs You should help the patient with basic needs in the home and provide support for getting groceries, prescriptions, and other personal needs.  Monitor the patients symptoms If they are getting sicker, call his or her medical provider and tell them that the patient has, or is being evaluated for, COVID-19 infection. This will help the healthcare providers office take steps to keep other people from getting infected. Ask the healthcare provider to call the local or state health department.  Limit the number of people who have contact with the patient  If possible, have only one caregiver for the patient.  Other household members should stay in another home or place of residence. If this is not possible, they should stay  in another room, or be separated from the patient as much as possible. Use a separate bathroom, if available.  Restrict visitors who do not have an essential need to be in the home.  Keep older adults, very young children, and other sick people away from the patient Keep older adults, very young children, and those who have compromised immune systems or chronic health conditions away from the patient. This includes people with chronic heart, lung, or kidney conditions, diabetes, and  cancer.  Ensure good ventilation Make sure that shared spaces in the home have good air flow, such as from an air conditioner or an opened window, weather permitting.  Wash your hands often  Wash your hands often and thoroughly with soap and water for at least 20 seconds. You can use an alcohol based hand sanitizer if soap and water are not available and if your hands are not visibly dirty.  Avoid touching your eyes, nose, and mouth with unwashed hands.  Use disposable paper towels to dry your hands. If not available, use dedicated cloth towels and replace them when they become wet.  Wear a facemask and gloves  Wear a disposable facemask at all times in the room and gloves when you touch or have contact with the patients blood, body fluids, and/or secretions or excretions, such as sweat, saliva, sputum, nasal mucus, vomit, urine, or feces.  Ensure the mask fits over your nose and mouth tightly, and do not touch it during use.  Throw out disposable facemasks and gloves after using them. Do not reuse.  Wash your hands immediately after removing your facemask and gloves.  If your personal clothing becomes contaminated, carefully remove clothing and launder. Wash your hands after handling contaminated clothing.  Place all used disposable facemasks, gloves, and  other waste in a lined container before disposing them with other household waste.  Remove gloves and wash your hands immediately after handling these items.  Do not share dishes, glasses, or other household items with the patient  Avoid sharing household items. You should not share dishes, drinking glasses, cups, eating utensils, towels, bedding, or other items with a patient who is confirmed to have, or being evaluated for, COVID-19 infection.  After the person uses these items, you should wash them thoroughly with soap and water.  Wash laundry thoroughly  Immediately remove and wash clothes or bedding that have blood, body  fluids, and/or secretions or excretions, such as sweat, saliva, sputum, nasal mucus, vomit, urine, or feces, on them.  Wear gloves when handling laundry from the patient.  Read and follow directions on labels of laundry or clothing items and detergent. In general, wash and dry with the warmest temperatures recommended on the label.  Clean all areas the individual has used often  Clean all touchable surfaces, such as counters, tabletops, doorknobs, bathroom fixtures, toilets, phones, keyboards, tablets, and bedside tables, every day. Also, clean any surfaces that may have blood, body fluids, and/or secretions or excretions on them.  Wear gloves when cleaning surfaces the patient has come in contact with.  Use a diluted bleach solution (e.g., dilute bleach with 1 part bleach and 10 parts water) or a household disinfectant with a label that says EPA-registered for coronaviruses. To make a bleach solution at home, add 1 tablespoon of bleach to 1 quart (4 cups) of water. For a larger supply, add  cup of bleach to 1 gallon (16 cups) of water.  Read labels of cleaning products and follow recommendations provided on product labels. Labels contain instructions for safe and effective use of the cleaning product including precautions you should take when applying the product, such as wearing gloves or eye protection and making sure you have good ventilation during use of the product.  Remove gloves and wash hands immediately after cleaning.  Monitor yourself for signs and symptoms of illness Caregivers and household members are considered close contacts, should monitor their health, and will be asked to limit movement outside of the home to the extent possible. Follow the monitoring steps for close contacts listed on the symptom monitoring form.   ? If you have additional questions, contact your local health department or call the epidemiologist on call at [phone] (available 24/7). ? This  guidance is subject to change. For the most up-to-date guidance from [facility], please refer to their website: [email or web address]

## 2018-08-13 NOTE — Progress Notes (Signed)
CardioVascular Rewsearch Department and AHF Team  ReDS Research Project   Patient #: [id number]   ReDS Measurement  Right: 22 %  Left: 21 %

## 2018-08-13 NOTE — Research (Signed)
Late Entry for 11 Aug 2018   VITAK- 19 Antibody Fast Detection Kit Study  Informed Consent  Subject Name: Yvette [name]  This Patient, has been consented to Arm 1 of the above reference Clinical Trial in compliance with FDA regulations, GCP Guidelines and [employer] requirements. The informed consent and study related procedures have been explained to the subject by Study Coordinator prior to obtaining consent. The subject expressed comprehension and requirements of the study and subject involved activities. No study procedures were conducted prior to obtained consent. The patient was given ample time to review the consent form. The subject was informed that this study is for the purpose of research and that participation was completely voluntary. All questions were answered to the satisfaction of the subject. The subject voluntarily signed consent version 1.0 on [id number]. A copy of the signed consent was given to the patient and a copy was placed in the subject's medical record. The subject was thanked for their participation and contribution to science and research.   PI Dr. Brand Males, participated via video conference in the consent process.   Due to the nature of this study being of minimal risk, the [employer] Informed Consent Document Checklist and Coordinator Visit Worksheet will not be utilized for the inpatient arm of this trial.  Please refer to the subject paper source binder for further information regarding todays' visit.     [name] BS, [place], [place], [place]

## 2018-08-13 NOTE — Progress Notes (Signed)
Patient given discharge instructions and verbalizes understanding. Will take out via WC. Patient with no complaints at the current time.

## 2018-08-13 NOTE — Discharge Summary (Signed)
Yvette [name] ZOX:[id number] DOB: 1971-01-23 DOA: 08/09/2018  PCP: Georgianne Fick, MD  Admit date: 08/09/2018  Discharge date: 08/13/2018  Admitted From: Home  Disposition:  Home   Recommendations for Outpatient Follow-up:   Follow up with PCP in 1-2 weeks  PCP Please obtain BMP/CBC, 2 view CXR in 1week,  (see Discharge instructions)   PCP Please follow up on the following pending results:    Home Health: None   Equipment/Devices: None  Consultations: None Discharge Condition: Stable   CODE STATUS: Full   Diet Recommendation: Heart Healthy      Chief Complaint  Patient presents with   Weakness    COVID +     Brief history of present illness from the day of admission and additional interim summary    47yo w/IBSand chronic 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 She hadbeen ill for 10 days with cough, N/V, fever, body aches when she presented to the ED for evaluation where she was confirmed COVID +. She was not hypoxic, but required admission due to intractable N/V w/ significant volume depletion.                                                                 Hospital Course     1. Acute Covid 19 Viral Illness during the ongoing 2020 Covid 19 Pandemic primary gastroenteritis - GI symptoms completely resolved, no cough or shortness of breath.  Inflammatory markers have stabilized and trending down.  She is at baseline and symptom-free.  Will be discharged home with PCP follow-up in a week.  COVID home care instructions provided in writing.  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  In a nasal swab test.  She was told this  by me.  COVID-19 Labs  Recent Labs    08/11/18 0500 08/12/18 0519 08/12/18 1130 08/13/18 0557  DDIMER  --   --  0.55* 0.41  FERRITIN  --  1,947*  --  1,452*  CRP 3.5* 1.6*  --  0.9    Lab Results  Component Value Date   SARSCOV2NAA POSITIVE (A) 08/09/2018     2.  Mildly elevated LFTs - trend is stable, elevation due to COVID-19 infection.  Right upper quadrant ultrasound stable.  3.  Hypokalemia.  Replaced and stable.    Discharge diagnosis     Principal Problem:   COVID-19 Active Problems:   Intractable nausea and vomiting   Transaminitis   Intractable vomiting    Discharge instructions    Discharge Instructions    Diet - low sodium heart healthy   Complete by:  As directed  Discharge instructions   Complete by:  As directed    Follow with Primary MD Georgianne Fick, MD in 7 days   Get CBC, CMP, 2 view Chest X ray -  checked  by Primary MD  in 5-7 days   Activity: As tolerated with Full fall precautions use walker/cane & assistance as needed  Disposition Home    Diet: Heart Healthy     Special Instructions: If you have smoked or chewed Tobacco  in the last 2 yrs please stop smoking, stop any regular Alcohol  and or any Recreational drug use.  On your next visit with your primary care physician please Get Medicines reviewed and adjusted.  Please request your Prim.MD to go over all Hospital Tests and Procedure/Radiological results at the follow up, please get all Hospital records sent to your Prim MD by signing hospital release before you go home.  If you experience worsening of your admission symptoms, develop shortness of breath, life threatening emergency, suicidal or homicidal thoughts you must seek medical attention immediately by calling 911 or calling your MD immediately  if symptoms less severe.  You Must read complete instructions/literature along with all the possible adverse reactions/side effects for all the Medicines you take and  that have been prescribed to you. Take any new Medicines after you have completely understood and accpet all the possible adverse reactions/side effects.   Increase activity slowly   Complete by:  As directed       Discharge Medications   Allergies as of 08/13/2018      Reactions   Doxycycline Rash   Morphine And Related Palpitations      Medication List    STOP taking these medications   ibuprofen 800 MG tablet Commonly known as:  ADVIL     TAKE these medications   clonazePAM 0.5 MG tablet Commonly known as:  KLONOPIN Take 0.5 mg by mouth 2 (two) times daily.   fluticasone 50 MCG/ACT nasal spray Commonly known as:  FLONASE Place 2 sprays into both nostrils daily.   gabapentin 100 MG capsule Commonly known as:  Neurontin Take 1 capsule (100 mg total) by mouth 3 (three) times daily. One three times daily   multivitamin with minerals Tabs tablet Take 1 tablet by mouth daily.   omeprazole 40 MG capsule Commonly known as:  PRILOSEC Take 1 capsule (40 mg total) by mouth every morning. Take 30-60 minutes before breakfast   polyethylene glycol 17 g packet Commonly known as:  MIRALAX / GLYCOLAX Take 17 g by mouth daily as needed for mild constipation.   ranitidine 150 MG tablet Commonly known as:  Zantac Take 1 tablet (150 mg total) by mouth at bedtime.   TURMERIC PO Take by mouth.       Follow-up Information    Georgianne Fick, MD. Schedule an appointment as soon as possible for a visit in 1 week(s).   Specialty:  Internal Medicine Contact information: [address] [place] [address] [phone]           Major procedures and Radiology Reports - PLEASE review detailed and final reports thoroughly  -       Dg Chest Port 1 View  Result Date: 08/09/2018 CLINICAL DATA:  48 year old female with increasing weakness EXAM: PORTABLE CHEST 1 VIEW COMPARISON:  12/30/2017, 05/31/2018 FINDINGS: Low lung volumes with linear opacities at the  lung bases. Cardiomediastinal silhouette likely unchanged given the low lung volumes. No pleural effusion or pneumothorax. No confluent airspace disease. No  displaced fracture. IMPRESSION: Low lung volumes with likely basilar atelectasis. Electronically Signed   By: Gilmer MorJaime  Wagner D.O.   On: 08/09/2018 10:16   [place] Abdomen Limited Ruq  Result Date: 08/09/2018 CLINICAL DATA:  Elevated LFTs. EXAM: ULTRASOUND ABDOMEN LIMITED RIGHT UPPER QUADRANT COMPARISON:  Ultrasound 12/30/2011 and chest CT 03/14/2017 FINDINGS: Gallbladder: No gallstones or wall thickening visualized. No sonographic Murphy sign noted by sonographer. Common bile duct: Diameter: 3.5 mm. Liver: No focal lesion identified. Within normal limits in parenchymal echogenicity. Portal vein is patent on color Doppler imaging with normal direction of blood flow towards the liver. IMPRESSION: Normal right upper quadrant ultrasound. Electronically Signed   By: Elberta Fortisaniel  Boyle M.D.   On: 08/09/2018 14:48    Micro Results     Recent Results (from the past 240 hour(s))  Blood Culture (routine x 2)     Status: None (Preliminary result)   Collection Time: 08/09/18  9:24 AM  Result Value Ref Range Status   Specimen Description   Final    BLOOD LEFT ANTECUBITAL Performed at [facility], [address] [place], [place] [address]    Special Requests   Final    BOTTLES DRAWN AEROBIC AND ANAEROBIC Blood Culture results may not be optimal due to an excessive volume of blood received in culture bottles Performed at [facility], [address] [place], [place] [address]    Culture   Final    NO GROWTH 4 DAYS Performed at [facility], [address] [place], [place] [address]    Report Status PENDING  Incomplete  Blood Culture (routine x 2)     Status: None (Preliminary result)   Collection Time: 08/09/18  9:29 AM  Result Value Ref Range Status   Specimen Description   Final    BLOOD RIGHT ANTECUBITAL Performed at  [facility], [address] [place], [place] [address]    Special Requests   Final    BOTTLES DRAWN AEROBIC AND ANAEROBIC Blood Culture adequate volume Performed at [facility], [address] [place], [place] [address]    Culture   Final    NO GROWTH 4 DAYS Performed at [facility], [address] [place], [place] [address]    Report Status PENDING  Incomplete  SARS Coronavirus 2 [facility](Hospital order, Performed in [facility] hospital lab)     Status: Abnormal   Collection Time: 08/09/18  9:46 AM  Result Value Ref Range Status   SARS Coronavirus 2 POSITIVE (A) NEGATIVE Final    Comment: RESULT CALLED TO, READ BACK BY AND VERIFIED WITH: K.HALL RN AT 1100 ON 08/09/2018 BY S.VANHOORNE (NOTE) If result is NEGATIVE SARS-CoV-2 target nucleic acids are NOT DETECTED. The SARS-CoV-2 RNA is generally detectable in upper and lower  respiratory specimens during the acute phase of infection. The lowest  concentration of SARS-CoV-2 viral copies this assay can detect is 250  copies / mL. A negative result does not preclude SARS-CoV-2 infection  and should not be used as the sole basis for treatment or other  patient management decisions.  A negative result may occur with  improper specimen collection / handling, submission of specimen other  than nasopharyngeal swab, presence of viral mutation(s) within the  areas targeted by this assay, and inadequate number of viral copies  (<250 copies / mL). A negative result must be combined with clinical  observations, patient history, and epidemiological information. If result is POSITIVE SARS-CoV-2 target  nucleic acids are DE TECTED. The SARS-CoV-2 RNA is generally detectable in upper and lower  respiratory specimens during the acute phase of infection.  Positive  results are indicative of active infection with SARS-CoV-2.  Clinical  correlation with patient history and other diagnostic information is  necessary to  determine patient infection status.  Positive results do  not rule out bacterial infection or co-infection with other viruses. If result is PRESUMPTIVE POSTIVE SARS-CoV-2 nucleic acids MAY BE PRESENT.   A presumptive positive result was obtained on the submitted specimen  and confirmed on repeat testing.  While 2019 novel coronavirus  (SARS-CoV-2) nucleic acids may be present in the submitted sample  additional confirmatory testing may be necessary for epidemiological  and / or clinical management purposes  to differentiate between  SARS-CoV-2 and other Sarbecovirus currently known to infect humans.  If clinically indicated additional testing with an alternate test  methodology (L [id number]) is advised. The SARS-CoV-2 RNA is generally  detectable in upper and lower respiratory specimens during the acute  phase of infection. The expected result is Negative. Fact Sheet for Patients:  BoilerBrush.com.cy Fact Sheet for Healthcare Providers: https://pope.com/ This test is not yet approved or cleared by the [place] FDA and has been authorized for detection and/or diagnosis of SARS-CoV-2 by FDA under an Emergency Use Authorization (EUA).  This EUA will remain in effect (meaning this test can be used) for the duration of the COVID-19 declaration under Section 564(b)(1) of the Act, 21 U.S.C. section 360bbb-3(b)(1), unless the authorization is terminated or revoked sooner. Performed at [facility], [address] [place], [place] [address]     Today   Subjective    Yvette [name] today has no headache,no chest abdominal pain,no new weakness tingling or numbness, feels much better wants to go home today.     Objective   Blood pressure 116/73, pulse 78, temperature 97.9 F (36.6 C), resp. rate 20, height 5\' 2"  (1.575 m), weight 74.4 kg, last menstrual period 08/15/2015, SpO2 99 %.   Intake/Output Summary (Last 24  hours) at 08/13/2018 1124 Last data filed at 08/13/2018 0854 Gross per 24 hour  Intake 600 ml  Output --  Net 600 ml    Exam  Awake Alert, Oriented x 3, No new F.N deficits, Normal affect [place].AT,PERRAL Supple Neck,No JVD, No cervical lymphadenopathy appriciated.  Symmetrical Chest wall movement, Good air movement bilaterally, CTAB RRR,No Gallops,Rubs or new Murmurs, No Parasternal Heave +ve B.Sounds, Abd Soft, Non tender, No organomegaly appriciated, No rebound -guarding or rigidity. No Cyanosis, Clubbing or edema, No new Rash or bruise   Data Review   CBC w Diff:  Lab Results  Component Value Date   WBC 5.1 08/13/2018   HGB 10.7 (L) 08/13/2018   HCT 32.7 (L) 08/13/2018   PLT 461 (H) 08/13/2018   LYMPHOPCT 41 08/13/2018   MONOPCT 14 08/13/2018   EOSPCT 0 08/13/2018   BASOPCT 1 08/13/2018    CMP:  Lab Results  Component Value Date   NA 141 08/13/2018   K 3.6 08/13/2018   CL 106 08/13/2018   CO2 28 08/13/2018   BUN 5 (L) 08/13/2018   CREATININE 0.68 08/13/2018   PROT 7.3 08/13/2018   ALBUMIN 3.3 (L) 08/13/2018   BILITOT 0.6 08/13/2018   ALKPHOS 99 08/13/2018   AST 110 (H) 08/13/2018   ALT 109 (H) 08/13/2018  .   Total Time in preparing paper work, data evaluation and todays exam - 35 minutes  Horris Speros  Thedore Mins M.D on 08/13/2018 at 11:24 AM  [facility]   Office  [phone]

## 2018-08-14 LAB — CULTURE, BLOOD (ROUTINE X 2)
Culture: NO GROWTH
Culture: NO GROWTH
Special Requests: ADEQUATE

## 2018-08-18 ENCOUNTER — Telehealth (INDEPENDENT_AMBULATORY_CARE_PROVIDER_SITE_OTHER): Payer: Self-pay | Admitting: Internal Medicine

## 2018-08-18 NOTE — Telephone Encounter (Signed)
Unable to reach pt, texted link for MyChart Mobile App for COVID19 Home Monitoring.

## 2018-09-02 ENCOUNTER — Telehealth: Payer: Self-pay | Admitting: *Deleted

## 2018-09-02 NOTE — Telephone Encounter (Signed)
I called pt to ask if she would be willing to donate plasma since she is recovered from the COVID-19 to assist in the recovery of others with the virus.  I left a message also the name of the [email or web address] web site if she was interested to go on there and fill out the application.    I left the [phone] number to return my call or if she had questions.

## 2018-10-22 ENCOUNTER — Telehealth: Payer: Self-pay | Admitting: Gastroenterology

## 2018-10-22 NOTE — Telephone Encounter (Signed)
The pt was given the recommendation per Dr Carlean Purl that the study was not a complete study and no need to stop omeprazole at this time.  She will call back with any further concerns

## 2019-06-19 IMAGING — US ULTRASOUND ABDOMEN LIMITED
1 series · 14 of 25 positions shown · non-contrast
Comparison: Ultrasound 12/30/2011 and chest CT 03/14/2017

CLINICAL DATA: Elevated LFTs.

EXAM:
ULTRASOUND ABDOMEN LIMITED RIGHT UPPER QUADRANT

[Series 1: ultrasound abdomen limited · 14 of 51 slices shown]
[im 1/51]
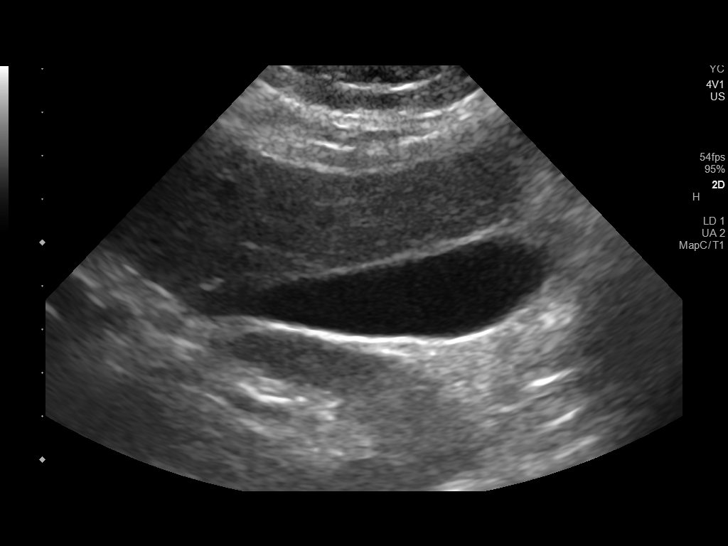
[im 5/51]
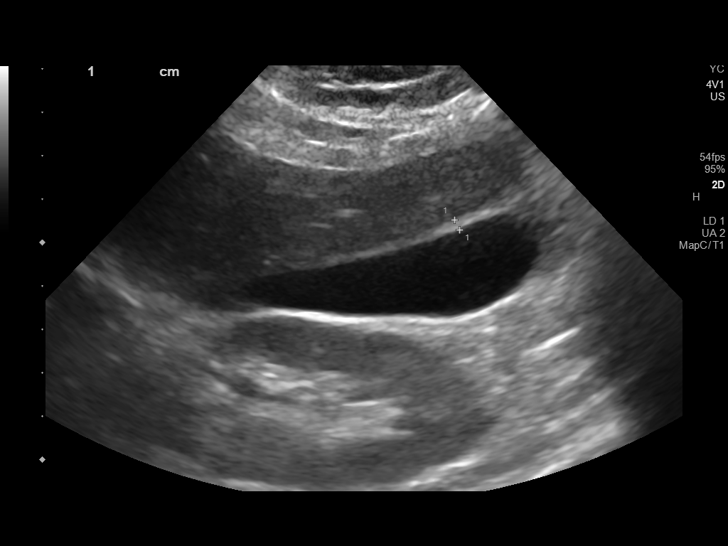
[im 9/51]
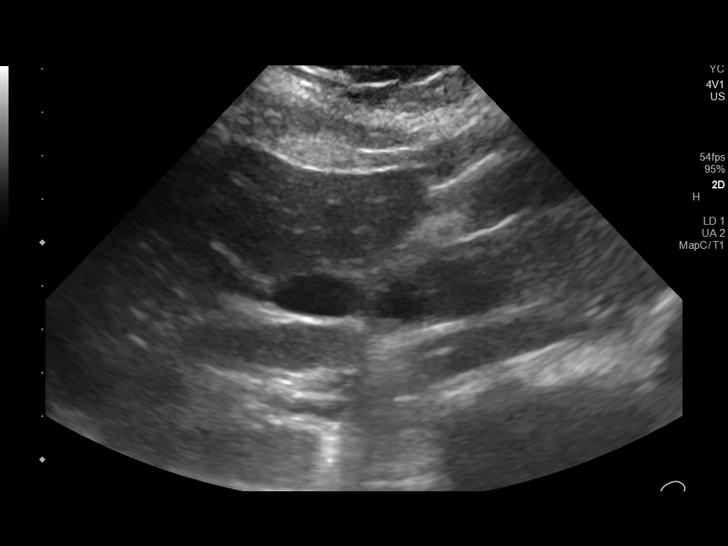
[im 13/51]
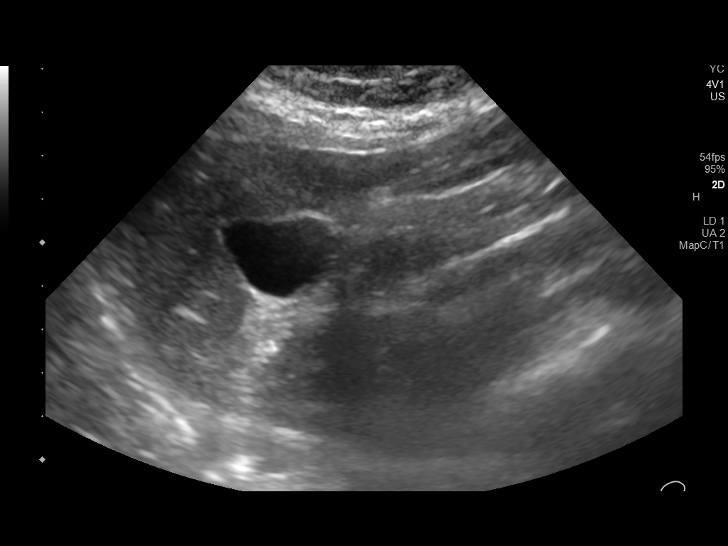
[im 17/51]
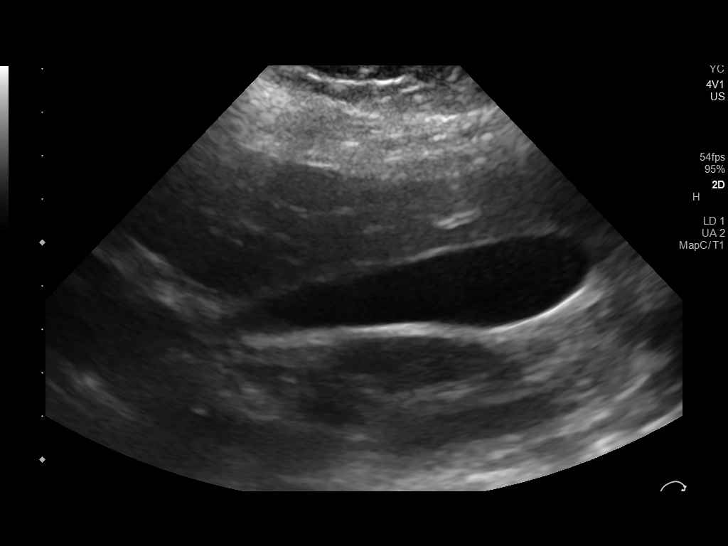
[im 19/51]
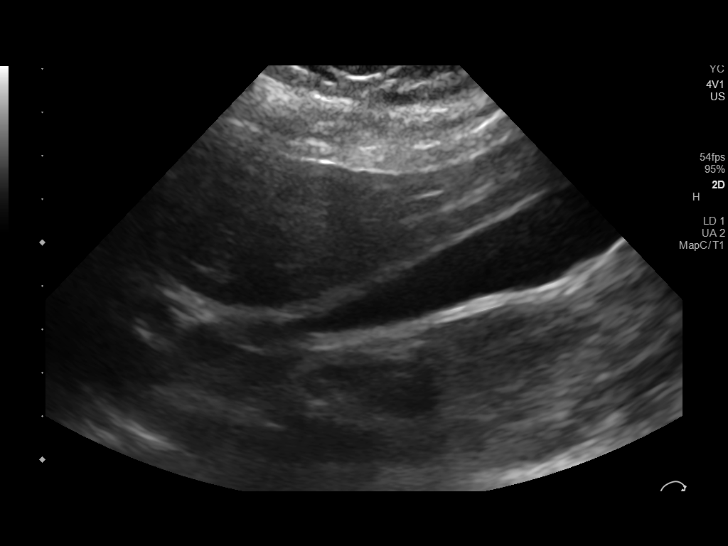
[im 23/51]
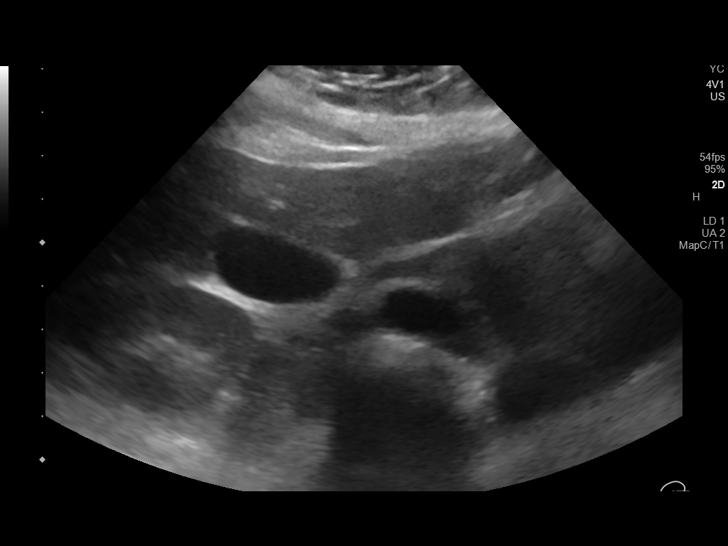
[im 28/51]
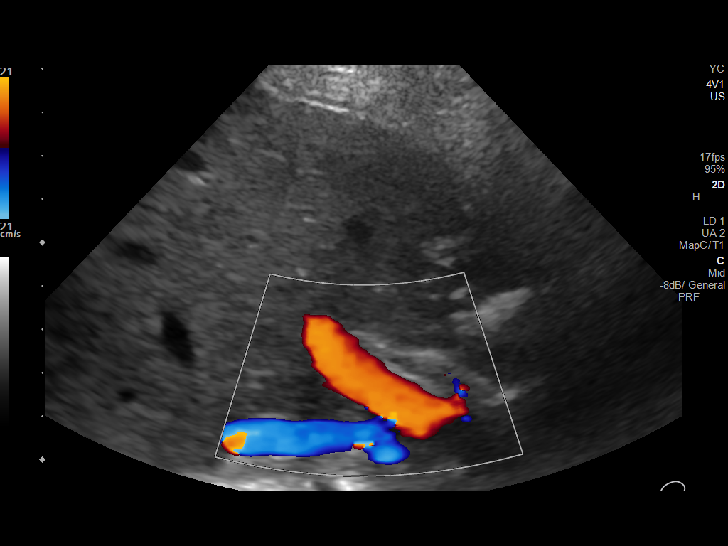
[im 32/51]
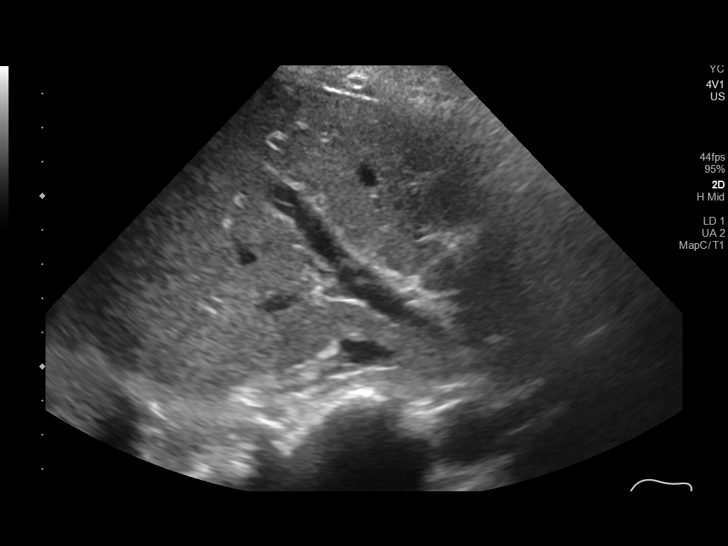
[im 34/51]
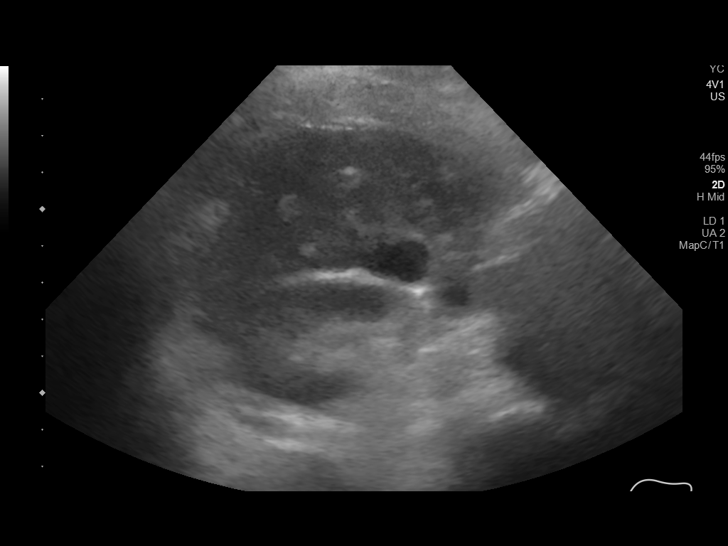
[im 38/51]
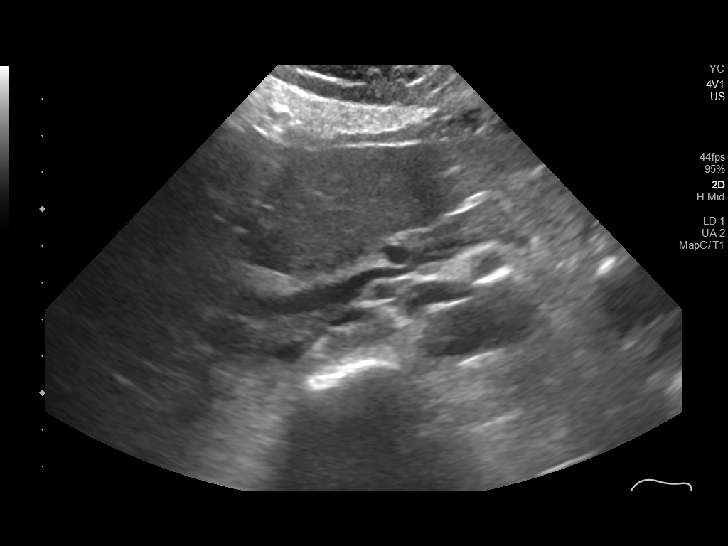
[im 42/51]
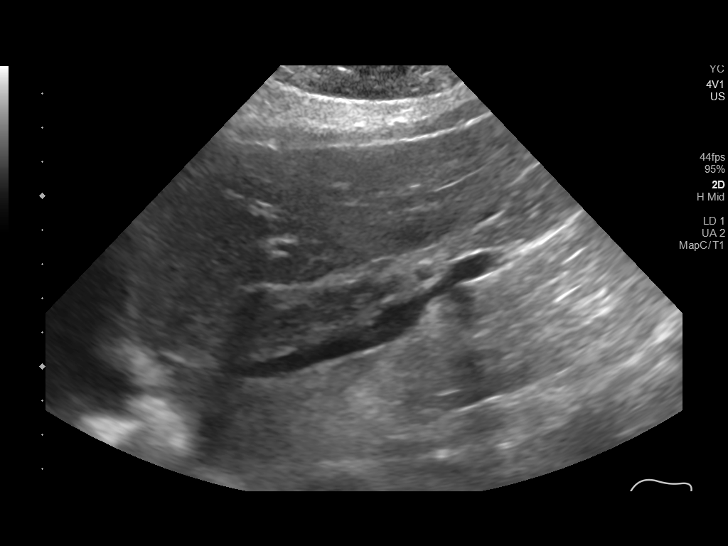
[im 46/51]
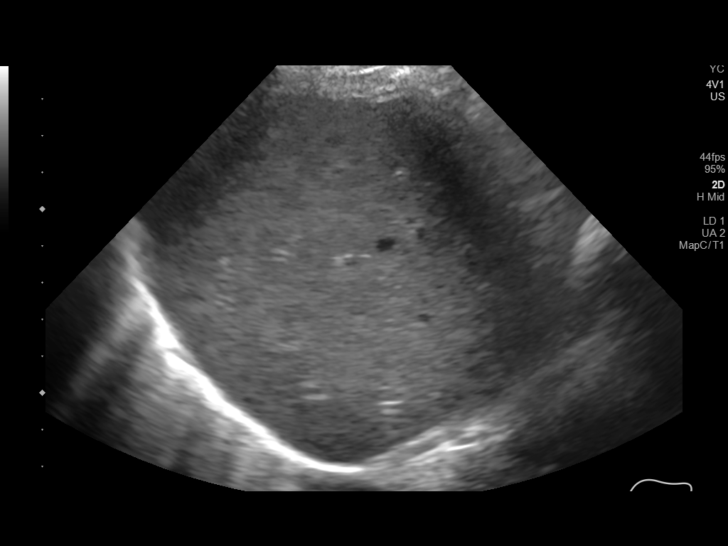
[im 51/51]
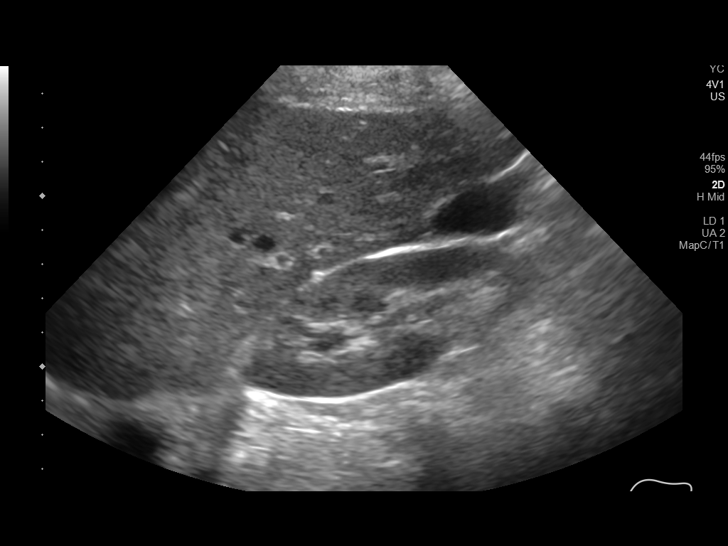

[14 of 25 positions shown; findings below may reference images not displayed]

FINDINGS: Gallbladder:

No gallstones or wall thickening visualized. No sonographic Murphy
sign noted by sonographer.

Common bile duct:

Diameter: 3.5 mm.

Liver:

No focal lesion identified. Within normal limits in parenchymal
echogenicity. Portal vein is patent on color Doppler imaging with
normal direction of blood flow towards the liver.
IMPRESSION: Normal right upper quadrant ultrasound.

## 2019-06-19 IMAGING — DX PORTABLE CHEST - 1 VIEW
1 series · 1 of 1 positions shown · non-contrast
Comparison: 12/30/2017, 05/31/2018

CLINICAL DATA: 47-year-old female with increasing weakness

EXAM:
PORTABLE CHEST 1 VIEW

[chest ap]
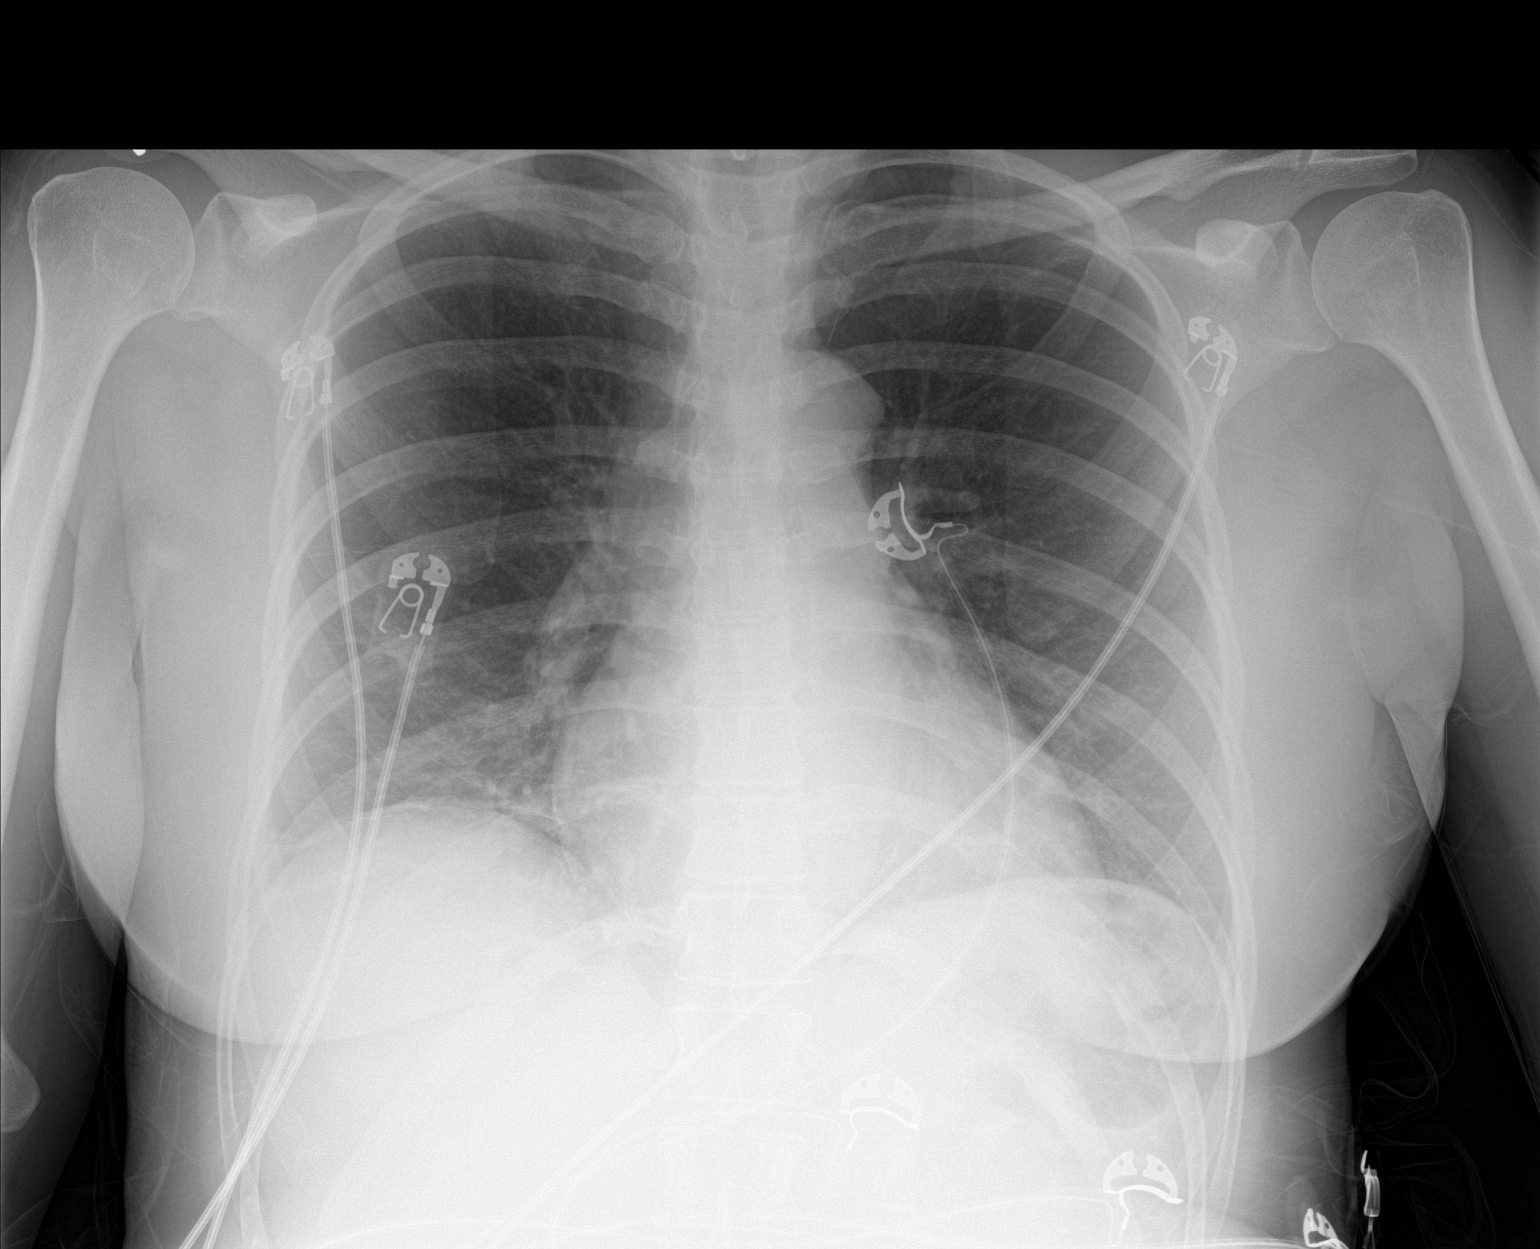

[1 of 1 positions shown; findings below may reference images not displayed]

FINDINGS: Low lung volumes with linear opacities at the lung bases.
Cardiomediastinal silhouette likely unchanged given the low lung
volumes. No pleural effusion or pneumothorax. No confluent airspace
disease. No displaced fracture.
IMPRESSION: Low lung volumes with likely basilar atelectasis.

## 2019-10-17 ENCOUNTER — Encounter: Payer: Self-pay | Admitting: Cardiology

## 2019-10-17 ENCOUNTER — Ambulatory Visit: Payer: HRSA Program | Admitting: Cardiology

## 2019-10-17 ENCOUNTER — Other Ambulatory Visit: Payer: Self-pay

## 2019-10-17 VITALS — BP 115/77 | HR 75 | Resp 16 | Ht 62.0 in | Wt 154.0 lb

## 2019-10-17 DIAGNOSIS — E785 Hyperlipidemia, unspecified: Secondary | ICD-10-CM

## 2019-10-17 DIAGNOSIS — R0789 Other chest pain: Secondary | ICD-10-CM

## 2019-10-17 DIAGNOSIS — M940 Chondrocostal junction syndrome [Tietze]: Secondary | ICD-10-CM

## 2019-10-17 NOTE — Progress Notes (Signed)
Primary Physician/Referring:  Merrilee Seashore, MD  Patient ID: Yvette [name], female    DOB: 09-01-1970, 49 y.o.   MRN: [id number]  Chief Complaint  Patient presents with  . Chest Pain  . New Patient (Initial Visit)    Referred by Dr. Ashby Dawes   HPI:     Yvette [name]  is a 49 y.o. no significant cardiac history, no history of hypertension, has very mild hyperlipidemia and recently has been diagnosed as having ankylosing spondylitis, referred to me for evaluation of chest pain.  Chest pain started about a month ago, described as sharp pain in the left side of the chest.  Mostly in the left side but occasionally in other areas.  Mostly notices during while she is resting.  Laying on the left side worsens the pain.  No other associated symptoms.  Past Medical History:  Diagnosis Date  . Chronic constipation   . IBS (irritable bowel syndrome)   . Menorrhagia   . OA (osteoarthritis)    Past Surgical History:  Procedure Laterality Date  . BILATERAL SALPINGECTOMY Bilateral 09/17/2015   Procedure: BILATERAL SALPINGECTOMY;  Surgeon: Molli Posey, MD;  Location: [facility];  Service: Gynecology;  Laterality: Bilateral;  . DIAGNOSTIC LAPAROSCOPY  10-12-2000  . HYSTEROSCOPY WITH D & C  summer 2016  . LAPAROSCOPIC ASSISTED VAGINAL HYSTERECTOMY N/A 09/17/2015   Procedure: LAPAROSCOPIC ASSISTED VAGINAL HYSTERECTOMY;  Surgeon: Molli Posey, MD;  Location: [facility];  Service: Gynecology;  Laterality: N/A;   Family History  Problem Relation Age of Onset  . Diabetes Maternal Aunt   . Cirrhosis Maternal Uncle   . Allergies Mother   . Heart failure Mother   . Emphysema Paternal Aunt        smoked  . Heart failure Father   . Colon cancer Neg Hx   . Pancreatic cancer Neg Hx     Social History   Tobacco Use  . Smoking status: Never Smoker  . Smokeless tobacco: Never Used  Substance Use Topics  . Alcohol use: Yes    Comment: OCCASIONAL    Marital Status: Married  ROS  Review of Systems  Cardiovascular: Positive for chest pain. Negative for dyspnea on exertion and leg swelling.  Gastrointestinal: Negative for melena.   Objective  Blood pressure 115/77, pulse 75, resp. rate 16, height _0  (1.575 m), weight 154 lb (69.9 kg), last menstrual period 08/15/2015, SpO2 97 %.  Vitals with BMI 10/17/2019 08/13/2018 08/13/2018  Height _1  - -  Weight 154 lbs - -  BMI 02.58 - -  Systolic 527 782 423  Diastolic 77 73 64  Pulse 75 - -     Physical Exam Cardiovascular:     Rate and Rhythm: Normal rate and regular rhythm.     Pulses: Intact distal pulses.     Heart sounds: Normal heart sounds. No murmur heard.  No gallop.      Comments: No leg edema, no JVD. Pulmonary:     Effort: Pulmonary effort is normal.     Breath sounds: Normal breath sounds.  Chest:     Chest wall: Tenderness (left costochondral junction) present.  Abdominal:     General: Bowel sounds are normal.     Palpations: Abdomen is soft.    Laboratory examination:   No results for input(s): NA, K, CL, CO2, GLUCOSE, BUN, CREATININE, CALCIUM, GFRNONAA, GFRAA in the last 8760 hours. CrCl cannot be calculated (Patient's most recent lab result is older than  the maximum 21 days allowed.).  CMP Latest Ref Rng & Units 08/13/2018 08/12/2018 08/11/2018  Glucose 70 - 99 mg/dL 94 91 93  BUN 6 - 20 mg/dL 5(L) 6 6  Creatinine 0.44 - 1.00 mg/dL 0.68 0.70 0.70  Sodium 135 - 145 mmol/L 141 142 143  Potassium 3.5 - 5.1 mmol/L 3.6 3.6 3.3(L)  Chloride 98 - 111 mmol/L 106 107 104  CO2 22 - 32 mmol/L _0 Calcium 8.9 - 10.3 mg/dL 8.5(L) 8.4(L) 8.6(L)  Total Protein 6.5 - 8.1 g/dL 7.3 7.0 7.3  Total Bilirubin 0.3 - 1.2 mg/dL 0.6 0.5 0.2(L)  Alkaline Phos 38 - 126 U/L 99 100 105  AST 15 - 41 U/L 110(H) 136(H) 230(H)  ALT 0 - 44 U/L 109(H) 129(H) 164(H)   CBC Latest Ref Rng & Units 08/13/2018 08/12/2018 08/11/2018  WBC 4.0 - 10.5 K/uL 5.1 5.4 5.4  Hemoglobin 12.0 -  15.0 g/dL 10.7(L) 11.1(L) 10.9(L)  Hematocrit 36 - 46 % 32.7(L) 33.6(L) 34.3(L)  Platelets 150 - 400 K/uL 461(H) 435(H) 394   Lipid Panel No results for input(s): CHOL, TRIG, LDLCALC, VLDL, HDL, CHOLHDL, LDLDIRECT in the last 8760 hours.  HEMOGLOBIN A1C No results found for: HGBA1C, MPG TSH No results for input(s): TSH in the last 8760 hours.  External labs:   Labs 09/28/2019:  Hb 12.1/HCT 36.9, platelets 319.  Serum glucose 99 mg, BUN 13, creatinine 0.76, EGFR 93/107 mL.  Potassium 4.5.  CMP normal.  Total cholesterol 211, triglycerides 77, HDL 60, LDL 137.  TSH normal at 2.45.Vitamin D 38.2.  Medications and allergies   Allergies  Allergen Reactions  . Doxycycline Rash  . Morphine And Related Palpitations     Current Outpatient Medications  Medication Instructions  . cholecalciferol (VITAMIN D3) 1,000 Units, Oral, Daily  . clonazePAM (KLONOPIN) 0.5 mg, Oral, 2 times daily  . fluticasone (FLONASE) 50 MCG/ACT nasal spray 2 sprays, Each Nare, Daily  . Multiple Vitamin (MULTIVITAMIN WITH MINERALS) TABS tablet 1 tablet, Oral, Daily  . omega-3 acid ethyl esters (LOVAZA) 1 g, Oral, Daily  . omeprazole (PRILOSEC) 40 mg, Oral, BH-each morning, Take 30-60 minutes before breakfast  . polyethylene glycol (MIRALAX / GLYCOLAX) 17 g, Oral, Daily PRN  . TURMERIC PO Oral    Radiology:   No results found.  Cardiac Studies:    EKG  EKG 10/17/2019: Normal sinus rhythm with rate of 69 bpm, normal axis.  No evidence of ischemia, normal EKG.  Assessment     ICD-10-CM   1. Costochondritis  M94.0 EKG 12-Lead  2. Musculoskeletal chest pain  R07.89   3. Mild hyperlipidemia  E78.5      Medications Discontinued During This Encounter  Medication Reason  . gabapentin (NEURONTIN) 100 MG capsule Patient Preference  . ranitidine (ZANTAC) 150 MG tablet Patient Preference  . aspirin 81 MG EC tablet Discontinued by provider     Recommendations:   Yvette [name]  is a 49 y.o. no  significant cardiac history, no history of hypertension, has very mild hyperlipidemia and recently has been diagnosed as having ankylosing spondylitis, referred to me for evaluation of chest pain.  Chest pain is clearly musculoskeletal.  I simply reassured her.  Reasons for reproducible pain explained to the patient. Heat to tender area. Cold ice compressions to reduce inflammation. Recommended Aleve OTC 1-2 tablets BID for 3-4 days and PRN. Reassured the patient.  Unless new symptoms, worsening symptoms, I will see her back on a as needed basis.  I reviewed her external labs, she does have very mild hyperlipidemia but does not need therapy for now, lifestyle modification discussed.  I have discontinued aspirin as there is no indication.Adrian Prows, MD, [facility] 10/17/2019, 12:10 PM Office: [phone]

## 2019-10-17 NOTE — Patient Instructions (Signed)
Reasons for reproducible pain explained to the patient. Heat to tender area. Cold ice compressions to reduce inflammation. Recommended Aleve OTC 1-2 tablets BID for 3-4 days and PRN. Reassured the patient.   

## 2020-05-02 DIAGNOSIS — N644 Mastodynia: Secondary | ICD-10-CM | POA: Diagnosis not present

## 2020-05-02 DIAGNOSIS — N951 Menopausal and female climacteric states: Secondary | ICD-10-CM | POA: Diagnosis not present

## 2020-05-03 ENCOUNTER — Ambulatory Visit (INDEPENDENT_AMBULATORY_CARE_PROVIDER_SITE_OTHER): Payer: BC Managed Care – PPO | Admitting: Physician Assistant

## 2020-05-03 ENCOUNTER — Other Ambulatory Visit: Payer: Self-pay

## 2020-05-03 ENCOUNTER — Other Ambulatory Visit (INDEPENDENT_AMBULATORY_CARE_PROVIDER_SITE_OTHER): Payer: BC Managed Care – PPO

## 2020-05-03 ENCOUNTER — Encounter: Payer: Self-pay | Admitting: Physician Assistant

## 2020-05-03 VITALS — BP 110/70 | HR 62 | Ht 62.0 in | Wt 154.0 lb

## 2020-05-03 DIAGNOSIS — Z1211 Encounter for screening for malignant neoplasm of colon: Secondary | ICD-10-CM | POA: Diagnosis not present

## 2020-05-03 DIAGNOSIS — R6881 Early satiety: Secondary | ICD-10-CM | POA: Diagnosis not present

## 2020-05-03 DIAGNOSIS — R110 Nausea: Secondary | ICD-10-CM

## 2020-05-03 DIAGNOSIS — Z1212 Encounter for screening for malignant neoplasm of rectum: Secondary | ICD-10-CM

## 2020-05-03 DIAGNOSIS — R634 Abnormal weight loss: Secondary | ICD-10-CM

## 2020-05-03 DIAGNOSIS — K219 Gastro-esophageal reflux disease without esophagitis: Secondary | ICD-10-CM

## 2020-05-03 DIAGNOSIS — R1013 Epigastric pain: Secondary | ICD-10-CM

## 2020-05-03 LAB — CBC WITH DIFFERENTIAL/PLATELET
Basophils Absolute: 0 10*3/uL (ref 0.0–0.1)
Basophils Relative: 0.7 % (ref 0.0–3.0)
Eosinophils Absolute: 0 10*3/uL (ref 0.0–0.7)
Eosinophils Relative: 0.3 % (ref 0.0–5.0)
HCT: 36.6 % (ref 36.0–46.0)
Hemoglobin: 12.5 g/dL (ref 12.0–15.0)
Lymphocytes Relative: 42 % (ref 12.0–46.0)
Lymphs Abs: 2.4 10*3/uL (ref 0.7–4.0)
MCHC: 34 g/dL (ref 30.0–36.0)
MCV: 89.9 fl (ref 78.0–100.0)
Monocytes Absolute: 0.5 10*3/uL (ref 0.1–1.0)
Monocytes Relative: 8.4 % (ref 3.0–12.0)
Neutro Abs: 2.8 10*3/uL (ref 1.4–7.7)
Neutrophils Relative %: 48.6 % (ref 43.0–77.0)
Platelets: 283 10*3/uL (ref 150.0–400.0)
RBC: 4.07 Mil/uL (ref 3.87–5.11)
RDW: 13 % (ref 11.5–15.5)
WBC: 5.7 10*3/uL (ref 4.0–10.5)

## 2020-05-03 LAB — COMPREHENSIVE METABOLIC PANEL
ALT: 6 U/L (ref 0–35)
AST: 15 U/L (ref 0–37)
Albumin: 4.3 g/dL (ref 3.5–5.2)
Alkaline Phosphatase: 70 U/L (ref 39–117)
BUN: 10 mg/dL (ref 6–23)
CO2: 31 mEq/L (ref 19–32)
Calcium: 9.7 mg/dL (ref 8.4–10.5)
Chloride: 102 mEq/L (ref 96–112)
Creatinine, Ser: 0.74 mg/dL (ref 0.40–1.20)
GFR: 95.01 mL/min (ref >60.00)
Glucose, Bld: 87 mg/dL (ref 70–99)
Potassium: 4 mEq/L (ref 3.5–5.1)
Sodium: 136 mEq/L (ref 135–145)
Total Bilirubin: 0.4 mg/dL (ref 0.2–1.2)
Total Protein: 7.8 g/dL (ref 6.0–8.3)

## 2020-05-03 LAB — LIPASE: Lipase: 61 U/L — ABNORMAL HIGH (ref 11.0–59.0)

## 2020-05-03 MED ORDER — METOCLOPRAMIDE HCL 10 MG PO TABS: 10.0000 mg | ORAL_TABLET | ORAL | 0 refills | Status: AC

## 2020-05-03 NOTE — Progress Notes (Signed)
Chief Complaint: Trouble eating and weight loss  HPI:    Yvette [name] is a 50 year old African-American female with a past medical history of IBS and others listed below, known to Dr. Christella Hartigan, who was referred to me by Georgianne Fick, MD for a complaint of trouble eating and weight loss.     04/13/2018 office visit with Gunnar Fusi for 23-month history of intermittent nausea and throat discomfort.  At that time set up for EGD and continued on omeprazole 40 mg in the morning.    04/26/2018 EGD for nausea and a sore throat as well as coughing with mild gastritis and otherwise normal.  Patient placed on omeprazole in the morning and Pepcid at night.    08/09/2018 right upper quadrant ultrasound is normal.    Today, the patient presents to clinic and tells me that since Thanksgiving she has noticed that she really just has not been able to eat as much.  Tells me that she can only have half of a full meal anytime that she eats.  Describes that when she does eat she feels very full and this will last almost the rest of the day.  Due to this has been on a mostly liquid diet but did stop for noodles last night and felt so full up that she had to sit up in bed for hours and take a Pepcid which really did not help.  Associated symptoms include nausea and an associated weight loss of 5 to 6 pounds.    Currently reports that her constipation is under control, she does feel like she is having good full bowel movements.    Denies fever, chills, blood in her stool or symptoms that awaken her from sleep.  Past Medical History:  Diagnosis Date  . Chronic constipation   . IBS (irritable bowel syndrome)   . Menorrhagia   . OA (osteoarthritis)     Past Surgical History:  Procedure Laterality Date  . BILATERAL SALPINGECTOMY Bilateral 09/17/2015   Procedure: BILATERAL SALPINGECTOMY;  Surgeon: Richarda Overlie, MD;  Location: [facility];  Service: Gynecology;  Laterality: Bilateral;  . DIAGNOSTIC  LAPAROSCOPY  10-12-2000  . HYSTEROSCOPY WITH D & C  summer 2016  . LAPAROSCOPIC ASSISTED VAGINAL HYSTERECTOMY N/A 09/17/2015   Procedure: LAPAROSCOPIC ASSISTED VAGINAL HYSTERECTOMY;  Surgeon: Richarda Overlie, MD;  Location: [facility];  Service: Gynecology;  Laterality: N/A;    Current Outpatient Medications  Medication Sig Dispense Refill  . cholecalciferol (VITAMIN D3) 25 MCG (1000 UNIT) tablet Take 1,000 Units by mouth daily.    . clonazePAM (KLONOPIN) 0.5 MG tablet Take 0.5 mg by mouth 2 (two) times daily.     Marland Kitchen docusate sodium (COLACE) 100 MG capsule 1 capsule as needed    . etanercept (ENBREL SURECLICK) 50 MG/ML injection 50 mg    . fluticasone (FLONASE) 50 MCG/ACT nasal spray Place 2 sprays into both nostrils daily.    Marland Kitchen gabapentin (NEURONTIN) 100 MG capsule Take 100 mg by mouth at bedtime.    . Magnesium 500 MG CAPS Take by mouth.    . Multiple Vitamin (MULTIVITAMIN WITH MINERALS) TABS tablet Take 1 tablet by mouth daily.    Marland Kitchen omega-3 acid ethyl esters (LOVAZA) 1 g capsule Take 1 g by mouth daily.     Marland Kitchen omeprazole (PRILOSEC) 40 MG capsule Take 1 capsule (40 mg total) by mouth every morning. Take 30-60 minutes before breakfast 30 capsule 5  . polyethylene glycol (MIRALAX / GLYCOLAX) packet Take 17 g  by mouth daily as needed for mild constipation.    . Probiotic Product (PROBIOTIC-10 PO) Take by mouth.    . TURMERIC PO Take by mouth.     No current facility-administered medications for this visit.    Allergies as of 05/03/2020 - Review Complete 05/03/2020  Allergen Reaction Noted  . Doxycycline Rash 09/12/2015  . Morphine and related Palpitations 06/24/2018    Family History  Problem Relation Age of Onset  . Diabetes Maternal Aunt   . Cirrhosis Maternal Uncle   . Allergies Mother   . Heart failure Mother   . Emphysema Paternal Aunt        smoked  . Heart failure Father   . Colon cancer Neg Hx   . Pancreatic cancer Neg Hx     Social History    Socioeconomic History  . Marital status: Married    Spouse name: Not on file  . Number of children: 2  . Years of education: Not on file  . Highest education level: Not on file  Occupational History  . Occupation: Nurse  Tobacco Use  . Smoking status: Never Smoker  . Smokeless tobacco: Never Used  Vaping Use  . Vaping Use: Never used  Substance and Sexual Activity  . Alcohol use: Yes    Comment: OCCASIONAL  . Drug use: No  . Sexual activity: Yes    Birth control/protection: Surgical    Comment: husband had vasectomy  Other Topics Concern  . Not on file  Social History Narrative  . Not on file   Social Determinants of Health   Financial Resource Strain: Not on file  Food Insecurity: Not on file  Transportation Needs: Not on file  Physical Activity: Not on file  Stress: Not on file  Social Connections: Not on file  Intimate Partner Violence: Not on file    Review of Systems:    Constitutional: No weight loss, fever or chills Cardiovascular: No chest pain   Respiratory: No SOB  Gastrointestinal: See HPI and otherwise negative   Physical Exam:  Vital signs: BP 110/70   Pulse 62   Ht 5\' 2"  (1.575 m)   Wt 154 lb (69.9 kg)   LMP 08/15/2015 (Approximate)   BMI 28.17 kg/m   Constitutional:   Pleasant AA female appears to be in NAD, Well developed, Well nourished, alert and cooperative Head:  Normocephalic and atraumatic. Eyes:   PEERL, EOMI. No icterus. Conjunctiva pink. Ears:  Normal auditory acuity. Neck:  Supple Throat: Oral cavity and pharynx without inflammation, swelling or lesion.  Respiratory: Respirations even and unlabored. Lungs clear to auscultation bilaterally.   No wheezes, crackles, or rhonchi.  Cardiovascular: Normal S1, S2. No MRG. Regular rate and rhythm. No peripheral edema, cyanosis or pallor.  Gastrointestinal:  Soft, nondistended, moderate epigastric ttp with involuntary guarding. Normal bowel sounds. No appreciable masses or  hepatomegaly. Rectal:  Not performed.  Msk:  Symmetrical without gross deformities. Without edema, no deformity or joint abnormality.  Neurologic:  Alert and  oriented x4;  grossly normal neurologically.  Skin:   Dry and intact without significant lesions or rashes. Psychiatric: Demonstrates good judgement and reason without abnormal affect or behaviors.  No recent labs/imaging.  Assessment: 1.  Weight loss: With early satiety and decreased appetite as well as epigastric pain, reflux and nausea; consider PUD+/-gastritis versus other  2.  Early satiety 3.  GERD 4.  Nausea 5.  Screening for colorectal cancer: Patient is 39 and never had screening for colon cancer 6.  Epigastric pain: Likely related to all of above  Plan: 1.  Scheduled patient for an EGD and colonoscopy given that she has had some weight loss and early satiety but is also due for screening colonoscopy.  This was scheduled Dr. Barron Alvine in the [facility] as he had availability sooner than Dr. Christella Hartigan.  After time procedure patient will continue to follow with Dr. Christella Hartigan is her primary GI physician.  She has had her COVID vaccines and booster. 2.  Continue omeprazole 40 mg every morning and Pepcid nightly 3.  Ordered CBC, CMP and lipase. 4.  Prescribed Reglan 10 mg, 2 tabs.  1 to be taken 20-30 minutes before the first half of prep and the other before the second half. 5.  Patient to follow in clinic for recommendations after time procedures.  Hyacinth Meeker, PA-C [facility] 05/03/2020, 8:34 AM  Cc: Georgianne Fick, MD

## 2020-05-03 NOTE — Patient Instructions (Addendum)
If you are age 50 or older, your body mass index should be between 23-30. Your Body mass index is 28.17 kg/m. If this is out of the aforementioned range listed, please consider follow up with your Primary Care Provider.  If you are age 52 or younger, your body mass index should be between 19-25. Your Body mass index is 28.17 kg/m. If this is out of the aformentioned range listed, please consider follow up with your Primary Care Provider.  Continue Omeprazole 40 mg once daily for now.   Take one Reglan an hour before before each prep.  Your provider has requested that you go to the basement level for lab work before leaving today. Press "B" on the elevator. The lab is located at the first door on the left as you exit the elevator.  Due to recent changes in healthcare laws, you may see the results of your imaging and laboratory studies on MyChart before your provider has had a chance to review them.  We understand that in some cases there may be results that are confusing or concerning to you. Not all laboratory results come back in the same time frame and the provider may be waiting for multiple results in order to interpret others.  Please give [place] 48 hours in order for your provider to thoroughly review all the results before contacting the office for clarification of your results.   Thank you for choosing me and [facility].  Hyacinth Meeker, PA-C     .

## 2020-05-04 DIAGNOSIS — M9902 Segmental and somatic dysfunction of thoracic region: Secondary | ICD-10-CM | POA: Diagnosis not present

## 2020-05-04 DIAGNOSIS — M62830 Muscle spasm of back: Secondary | ICD-10-CM | POA: Diagnosis not present

## 2020-05-04 DIAGNOSIS — M9901 Segmental and somatic dysfunction of cervical region: Secondary | ICD-10-CM | POA: Diagnosis not present

## 2020-05-04 DIAGNOSIS — M9904 Segmental and somatic dysfunction of sacral region: Secondary | ICD-10-CM | POA: Diagnosis not present

## 2020-05-04 DIAGNOSIS — M9903 Segmental and somatic dysfunction of lumbar region: Secondary | ICD-10-CM | POA: Diagnosis not present

## 2020-05-04 NOTE — Progress Notes (Signed)
I agree with the above note, plan 

## 2020-05-08 DIAGNOSIS — M9904 Segmental and somatic dysfunction of sacral region: Secondary | ICD-10-CM | POA: Diagnosis not present

## 2020-05-08 DIAGNOSIS — M9902 Segmental and somatic dysfunction of thoracic region: Secondary | ICD-10-CM | POA: Diagnosis not present

## 2020-05-08 DIAGNOSIS — M62830 Muscle spasm of back: Secondary | ICD-10-CM | POA: Diagnosis not present

## 2020-05-08 DIAGNOSIS — M9901 Segmental and somatic dysfunction of cervical region: Secondary | ICD-10-CM | POA: Diagnosis not present

## 2020-05-08 DIAGNOSIS — M9903 Segmental and somatic dysfunction of lumbar region: Secondary | ICD-10-CM | POA: Diagnosis not present

## 2020-05-09 DIAGNOSIS — M62830 Muscle spasm of back: Secondary | ICD-10-CM | POA: Diagnosis not present

## 2020-05-09 DIAGNOSIS — M9903 Segmental and somatic dysfunction of lumbar region: Secondary | ICD-10-CM | POA: Diagnosis not present

## 2020-05-09 DIAGNOSIS — M9904 Segmental and somatic dysfunction of sacral region: Secondary | ICD-10-CM | POA: Diagnosis not present

## 2020-05-09 DIAGNOSIS — M9901 Segmental and somatic dysfunction of cervical region: Secondary | ICD-10-CM | POA: Diagnosis not present

## 2020-05-09 DIAGNOSIS — M9902 Segmental and somatic dysfunction of thoracic region: Secondary | ICD-10-CM | POA: Diagnosis not present

## 2020-05-10 NOTE — Progress Notes (Signed)
Agree with the assessment and plan as outlined by Jennifer Lemmon, PA-C. ? ?Brettney Ficken, DO, FACG ? ?

## 2020-05-11 DIAGNOSIS — M9903 Segmental and somatic dysfunction of lumbar region: Secondary | ICD-10-CM | POA: Diagnosis not present

## 2020-05-11 DIAGNOSIS — M9904 Segmental and somatic dysfunction of sacral region: Secondary | ICD-10-CM | POA: Diagnosis not present

## 2020-05-11 DIAGNOSIS — M9901 Segmental and somatic dysfunction of cervical region: Secondary | ICD-10-CM | POA: Diagnosis not present

## 2020-05-11 DIAGNOSIS — M9902 Segmental and somatic dysfunction of thoracic region: Secondary | ICD-10-CM | POA: Diagnosis not present

## 2020-05-11 DIAGNOSIS — M62830 Muscle spasm of back: Secondary | ICD-10-CM | POA: Diagnosis not present

## 2020-05-14 ENCOUNTER — Encounter: Payer: BC Managed Care – PPO | Admitting: Gastroenterology

## 2020-05-15 DIAGNOSIS — M9901 Segmental and somatic dysfunction of cervical region: Secondary | ICD-10-CM | POA: Diagnosis not present

## 2020-05-15 DIAGNOSIS — M9904 Segmental and somatic dysfunction of sacral region: Secondary | ICD-10-CM | POA: Diagnosis not present

## 2020-05-15 DIAGNOSIS — M9903 Segmental and somatic dysfunction of lumbar region: Secondary | ICD-10-CM | POA: Diagnosis not present

## 2020-05-15 DIAGNOSIS — M9902 Segmental and somatic dysfunction of thoracic region: Secondary | ICD-10-CM | POA: Diagnosis not present

## 2020-05-15 DIAGNOSIS — M62830 Muscle spasm of back: Secondary | ICD-10-CM | POA: Diagnosis not present

## 2020-05-18 DIAGNOSIS — M549 Dorsalgia, unspecified: Secondary | ICD-10-CM | POA: Diagnosis not present

## 2020-05-18 DIAGNOSIS — M4690 Unspecified inflammatory spondylopathy, site unspecified: Secondary | ICD-10-CM | POA: Diagnosis not present

## 2020-05-18 DIAGNOSIS — M459 Ankylosing spondylitis of unspecified sites in spine: Secondary | ICD-10-CM | POA: Diagnosis not present

## 2020-05-18 DIAGNOSIS — M1990 Unspecified osteoarthritis, unspecified site: Secondary | ICD-10-CM | POA: Diagnosis not present

## 2020-05-23 ENCOUNTER — Telehealth: Payer: Self-pay | Admitting: Physician Assistant

## 2020-05-23 ENCOUNTER — Other Ambulatory Visit: Payer: Self-pay

## 2020-05-23 MED ORDER — CLENPIQ 10-3.5-12 MG-GM -GM/160ML PO SOLN: 1.0000 | Freq: Once | ORAL | 0 refills | Status: AC

## 2020-05-23 NOTE — Telephone Encounter (Signed)
Sent prescription for Clenpiq to patients pharmacy [facility] [address]

## 2020-05-23 NOTE — Telephone Encounter (Signed)
[facility] states that [facility] not receive prescription [facility]. Pls send it again. Her procedure is on 3/2.

## 2020-05-30 ENCOUNTER — Other Ambulatory Visit: Payer: Self-pay

## 2020-05-30 ENCOUNTER — Ambulatory Visit (AMBULATORY_SURGERY_CENTER): Payer: BC Managed Care – PPO | Admitting: Gastroenterology

## 2020-05-30 ENCOUNTER — Encounter: Payer: Self-pay | Admitting: Gastroenterology

## 2020-05-30 VITALS — BP 115/79 | HR 80 | Temp 96.9°F | Resp 16 | Ht 62.0 in | Wt 154.0 lb

## 2020-05-30 DIAGNOSIS — K2960 Other gastritis without bleeding: Secondary | ICD-10-CM | POA: Diagnosis not present

## 2020-05-30 DIAGNOSIS — K2950 Unspecified chronic gastritis without bleeding: Secondary | ICD-10-CM | POA: Diagnosis not present

## 2020-05-30 DIAGNOSIS — K640 First degree hemorrhoids: Secondary | ICD-10-CM

## 2020-05-30 DIAGNOSIS — Z1211 Encounter for screening for malignant neoplasm of colon: Secondary | ICD-10-CM | POA: Diagnosis not present

## 2020-05-30 DIAGNOSIS — R634 Abnormal weight loss: Secondary | ICD-10-CM

## 2020-05-30 DIAGNOSIS — K219 Gastro-esophageal reflux disease without esophagitis: Secondary | ICD-10-CM

## 2020-05-30 DIAGNOSIS — K5730 Diverticulosis of large intestine without perforation or abscess without bleeding: Secondary | ICD-10-CM

## 2020-05-30 DIAGNOSIS — R6881 Early satiety: Secondary | ICD-10-CM

## 2020-05-30 MED ORDER — SODIUM CHLORIDE 0.9 % IV SOLN: 500.0000 mL | Freq: Once | INTRAVENOUS | Status: AC

## 2020-05-30 NOTE — Op Note (Signed)
[facility] Patient Name: Yvette [name] Procedure Date: 05/30/2020 12:56 PM MRN: [id number] Endoscopist: Doristine Locks , MD Age: 50 Referring MD:  Date of Birth: 1970/05/24 Gender: Female Account #: [id number] Procedure:                Colonoscopy Indications:              Screening for colorectal malignant neoplasm, This                            is the patient's first colonoscopy Medicines:                Monitored Anesthesia Care Procedure:                Pre-Anesthesia Assessment:                           - Prior to the procedure, a History and Physical                            was performed, and patient medications and                            allergies were reviewed. The patient's tolerance of                            previous anesthesia was also reviewed. The risks                            and benefits of the procedure and the sedation                            options and risks were discussed with the patient.                            All questions were answered, and informed consent                            was obtained. Prior Anticoagulants: The patient has                            taken no previous anticoagulant or antiplatelet                            agents. ASA Grade Assessment: II - A patient with                            mild systemic disease. After reviewing the risks                            and benefits, the patient was deemed in                            satisfactory condition to undergo the procedure.  After obtaining informed consent, the colonoscope                            was passed under direct vision. Throughout the                            procedure, the patient's blood pressure, pulse, and                            oxygen saturations were monitored continuously. The                            Olympus CF-[id number] (#[id number]) Colonoscope was                            introduced through the anus and  advanced to the the                            terminal ileum. The colonoscopy was performed                            without difficulty. The patient tolerated the                            procedure well. The quality of the bowel                            preparation was good. The terminal ileum, ileocecal                            valve, appendiceal orifice, and rectum were                            photographed. Scope In: 1:23:45 PM Scope Out: 1:39:06 PM Scope Withdrawal Time: 0 hours 10 minutes 53 seconds  Total Procedure Duration: 0 hours 15 minutes 21 seconds  Findings:                 The perianal and digital rectal examinations were                            normal.                           A few small-mouthed diverticula were found in the                            sigmoid colon.                           Non-bleeding internal hemorrhoids were found during                            retroflexion. The hemorrhoids were small and Grade  I (internal hemorrhoids that do not prolapse).                           The exam was otherwise normal throughout the                            remainder of the colon.                           The terminal ileum appeared normal. Complications:            No immediate complications. Estimated Blood Loss:     Estimated blood loss: none. Impression:               - Diverticulosis in the sigmoid colon.                           - Non-bleeding internal hemorrhoids.                           - The examined portion of the ileum was normal.                           - No specimens collected. Recommendation:           - Patient has a contact number available for                            emergencies. The signs and symptoms of potential                            delayed complications were discussed with the                            patient. Return to normal activities tomorrow.                            Written  discharge instructions were provided to the                            patient.                           - Resume previous diet.                           - Continue present medications.                           - Repeat colonoscopy in 10 years for screening                            purposes.                           - Return to GI office PRN. Doristine Locks, MD 05/30/2020 1:46:09 PM

## 2020-05-30 NOTE — Progress Notes (Signed)
VS by CW  I have reviewed the patient's medical history in detail and updated the computerized patient record.  

## 2020-05-30 NOTE — Progress Notes (Signed)
Called to room to assist during endoscopic procedure.  Patient ID and intended procedure confirmed with present staff. Received instructions for my participation in the procedure from the performing physician.  

## 2020-05-30 NOTE — Progress Notes (Signed)
PT taken to PACU. Monitors in place. VSS. Report given to RN. 

## 2020-05-30 NOTE — Patient Instructions (Signed)
YOU HAD AN ENDOSCOPIC PROCEDURE TODAY AT THE [facility]:   Refer to the procedure report that was given to you for any specific questions about what was found during the examination.  If the procedure report does not answer your questions, please call your gastroenterologist to clarify.  If you requested that your care partner not be given the details of your procedure findings, then the procedure report has been included in a sealed envelope for you to review at your convenience later.  YOU SHOULD EXPECT: Some feelings of bloating in the abdomen. Passage of more gas than usual.  Walking can help get rid of the air that was put into your GI tract during the procedure and reduce the bloating. If you had a lower endoscopy (such as a colonoscopy or flexible sigmoidoscopy) you may notice spotting of blood in your stool or on the toilet paper. If you underwent a bowel prep for your procedure, you may not have a normal bowel movement for a few days.  Please Note:  You might notice some irritation and congestion in your nose or some drainage.  This is from the oxygen used during your procedure.  There is no need for concern and it should clear up in a day or so.  SYMPTOMS TO REPORT IMMEDIATELY:   Following lower endoscopy (colonoscopy or flexible sigmoidoscopy):  Excessive amounts of blood in the stool  Significant tenderness or worsening of abdominal pains  Swelling of the abdomen that is new, acute  Fever of 100F or higher   Following upper endoscopy (EGD)  Vomiting of blood or coffee ground material  New chest pain or pain under the shoulder blades  Painful or persistently difficult swallowing  New shortness of breath  Fever of 100F or higher  Black, tarry-looking stools  For urgent or emergent issues, a gastroenterologist can be reached at any hour by calling [phone]. Do not use MyChart messaging for urgent concerns.      DIET:  We do recommend a small meal at first,  but then you may proceed to your regular diet.  Drink plenty of fluids but you should avoid alcoholic beverages for 24 hours.  MEDICATIONS: Continue present medications.  Please see handouts given to you by your recovery nurse.  Follow up with  Dr. Barron Alvine in his office as needed.  Thank you for allowing [place] to provide for your healthcare needs today.  ACTIVITY:  You should plan to take it easy for the rest of today and you should NOT DRIVE or use heavy machinery until tomorrow (because of the sedation medicines used during the test).    FOLLOW UP: Our staff will call the number listed on your records 48-72 hours following your procedure to check on you and address any questions or concerns that you may have regarding the information given to you following your procedure. If we do not reach you, we will leave a message.  We will attempt to reach you two times.  During this call, we will ask if you have developed any symptoms of COVID 19. If you develop any symptoms (ie: fever, flu-like symptoms, shortness of breath, cough etc.) before then, please call [phone].  If you test positive for Covid 19 in the 2 weeks post procedure, please call and report this information to [place].    If any biopsies were taken you will be contacted by phone or by letter within the next 1-3 weeks.  Please call us at [phone] if you  have not heard about the biopsies in 3 weeks.    SIGNATURES/CONFIDENTIALITY: You and/or your care partner have signed paperwork which will be entered into your electronic medical record.  These signatures attest to the fact that that the information above on your After Visit Summary has been reviewed and is understood.  Full responsibility of the confidentiality of this discharge information lies with you and/or your care-partner.

## 2020-05-30 NOTE — Op Note (Signed)
[facility] Patient Name: Yvette [name] Procedure Date: 05/30/2020 12:56 PM MRN: [id number] Endoscopist: Doristine Locks , MD Age: 50 Referring MD:  Date of Birth: 1971-01-22 Gender: Female Account #: [id number] Procedure:                Upper GI endoscopy Indications:              Suspected esophageal reflux, Early satiety, Weight                            loss Medicines:                Monitored Anesthesia Care Procedure:                Pre-Anesthesia Assessment:                           - Prior to the procedure, a History and Physical                            was performed, and patient medications and                            allergies were reviewed. The patient's tolerance of                            previous anesthesia was also reviewed. The risks                            and benefits of the procedure and the sedation                            options and risks were discussed with the patient.                            All questions were answered, and informed consent                            was obtained. Prior Anticoagulants: The patient has                            taken no previous anticoagulant or antiplatelet                            agents. ASA Grade Assessment: II - A patient with                            mild systemic disease. After reviewing the risks                            and benefits, the patient was deemed in                            satisfactory condition to undergo the procedure.  After obtaining informed consent, the endoscope was                            passed under direct vision. Throughout the                            procedure, the patient's blood pressure, pulse, and                            oxygen saturations were monitored continuously. The                            Endoscope was introduced through the mouth, and                            advanced to the second part of duodenum. The upper                             GI endoscopy was accomplished without difficulty.                            The patient tolerated the procedure well. Scope In: Scope Out: Findings:                 The examined esophagus was normal.                           The entire examined stomach was normal. Biopsies                            were taken with a cold forceps for Helicobacter                            pylori testing. Estimated blood loss was minimal.                           The examined duodenum was normal. Biopsies were                            taken with a cold forceps for histology. Estimated                            blood loss was minimal. Complications:            No immediate complications. Estimated Blood Loss:     Estimated blood loss was minimal. Impression:               - Normal esophagus.                           - Normal stomach. Biopsied.                           - Normal examined duodenum. Biopsied. Recommendation:           - Patient has a contact number available for  emergencies. The signs and symptoms of potential                            delayed complications were discussed with the                            patient. Return to normal activities tomorrow.                            Written discharge instructions were provided to the                            patient.                           - Resume previous diet.                           - Continue present medications.                           - Await pathology results.                           - Perform a colonoscopy today.                           - Return to GI clinic PRN. Doristine Locks, MD 05/30/2020 1:43:48 PM

## 2020-06-01 ENCOUNTER — Telehealth: Payer: Self-pay

## 2020-06-01 NOTE — Telephone Encounter (Signed)
LVM

## 2020-06-06 ENCOUNTER — Encounter: Payer: Self-pay | Admitting: Gastroenterology

## 2020-07-06 DIAGNOSIS — G4762 Sleep related leg cramps: Secondary | ICD-10-CM | POA: Diagnosis not present

## 2020-09-03 ENCOUNTER — Emergency Department (HOSPITAL_COMMUNITY)
Admission: EM | Admit: 2020-09-03 | Discharge: 2020-09-03 | Disposition: A | Discharge status: Home / Self Care | Payer: BC Managed Care – PPO | Attending: Emergency Medicine | Admitting: Emergency Medicine

## 2020-09-03 ENCOUNTER — Emergency Department (HOSPITAL_COMMUNITY): Payer: BC Managed Care – PPO

## 2020-09-03 ENCOUNTER — Encounter (HOSPITAL_COMMUNITY): Payer: Self-pay | Admitting: Emergency Medicine

## 2020-09-03 DIAGNOSIS — R079 Chest pain, unspecified: Secondary | ICD-10-CM | POA: Diagnosis not present

## 2020-09-03 DIAGNOSIS — R1013 Epigastric pain: Secondary | ICD-10-CM | POA: Diagnosis not present

## 2020-09-03 DIAGNOSIS — R0789 Other chest pain: Secondary | ICD-10-CM | POA: Diagnosis not present

## 2020-09-03 DIAGNOSIS — R072 Precordial pain: Secondary | ICD-10-CM | POA: Diagnosis not present

## 2020-09-03 DIAGNOSIS — Z8616 Personal history of COVID-19: Secondary | ICD-10-CM | POA: Insufficient documentation

## 2020-09-03 LAB — TROPONIN I (HIGH SENSITIVITY)
Troponin I (High Sensitivity): 4 ng/L (ref <18)
Troponin I (High Sensitivity): 3 ng/L (ref <18)

## 2020-09-03 LAB — BASIC METABOLIC PANEL
Anion gap: 8 (ref 5–15)
BUN: 15 mg/dL (ref 6–20)
CO2: 27 mmol/L (ref 22–32)
Calcium: 9.7 mg/dL (ref 8.9–10.3)
Chloride: 102 mmol/L (ref 98–111)
Creatinine, Ser: 0.86 mg/dL (ref 0.44–1.00)
GFR, Estimated: >60 mL/min (ref >60)
Glucose, Bld: 115 mg/dL — ABNORMAL HIGH (ref 70–99)
Potassium: 4.3 mmol/L (ref 3.5–5.1)
Sodium: 137 mmol/L (ref 135–145)

## 2020-09-03 LAB — CBC
HCT: 38.9 % (ref 36.0–46.0)
Hemoglobin: 12.7 g/dL (ref 12.0–15.0)
MCH: 30.1 pg (ref 26.0–34.0)
MCHC: 32.6 g/dL (ref 30.0–36.0)
MCV: 92.2 fL (ref 80.0–100.0)
Platelets: 317 10*3/uL (ref 150–400)
RBC: 4.22 MIL/uL (ref 3.87–5.11)
RDW: 12.8 % (ref 11.5–15.5)
WBC: 6.8 10*3/uL (ref 4.0–10.5)
nRBC: 0 % (ref 0.0–0.2)

## 2020-09-03 MED ORDER — LIDOCAINE VISCOUS HCL 2 % MT SOLN
15.0000 mL | Freq: Once | OROMUCOSAL | Status: AC
  Administered 2020-09-03: 15 mL via ORAL
  Filled 2020-09-03: qty 15

## 2020-09-03 MED ORDER — ALUMINUM & MAGNESIUM HYDROXIDE 200-200 MG/5ML PO SUSP: 10.0000 mL | Freq: Four times a day (QID) | ORAL | 0 refills | Status: AC | PRN

## 2020-09-03 MED ORDER — ALUM & MAG HYDROXIDE-SIMETH 200-200-20 MG/5ML PO SUSP
30.0000 mL | Freq: Once | ORAL | Status: AC
  Administered 2020-09-03: 30 mL via ORAL
  Filled 2020-09-03: qty 30

## 2020-09-03 NOTE — ED Provider Notes (Signed)
Emergency Medicine Provider Triage Evaluation Note  Yvette [name] , a 50 y.o. female  was evaluated in triage.  Pt complains of substernal stabbing chest pain radiating into her back x 3 days with mild SOB. Associated it with her reflux however no relief with pepcid and prilosec. No nausea or vomiting. + FHx of cardiac disease. No hx DVT/PE.  Review of Systems  Positive: + chest pain, SOB Negative: - nausea, vomiting, diaphoresis, leg swelling  Physical Exam  BP 129/78 (BP Location: Right Arm)   Pulse 77   Temp 98.7 F (37.1 C) (Oral)   Resp 20   Ht 5\' 2"  (1.575 m)   Wt 71.7 kg   LMP 08/15/2015 (Approximate)   SpO2 100%   BMI 28.90 kg/m  Gen:   Awake, no distress   Resp:  Normal effort  MSK:   Moves extremities without difficulty  Other:    Medical Decision Making  Medically screening exam initiated at 8:17 AM.  Appropriate orders placed.  Yvette [name] was informed that the remainder of the evaluation will be completed by another provider, this initial triage assessment does not replace that evaluation, and the importance of remaining in the ED until their evaluation is complete.     Cassandria Anger, PA-C 09/03/20 0818    11/03/20, MD 09/04/20 [phone]

## 2020-09-03 NOTE — ED Provider Notes (Signed)
[facility]-EMERGENCY DEPT Provider Note   CSN: [id number] Arrival date & time: 09/03/20  0732     History Chief Complaint  Patient presents with  . Chest Pain    Bobbiejo Ishikawa is a 50 y.o. female.  HPI   50 year old female with past medical history of IBS/GERD presents the emergency department with a "pulling" sensation in her epigastric/midsternal region.  Patient states that this started a couple days ago, its been constant, usually responds to her GI medication but it has not.  At one point the pulling sensation was radiating through to her back but that is resolved.  She denies any associated shortness of breath or leg swelling.  The symptoms are not exertional, sometimes worse with laying flat.  No other recent illness.  No cardiac history or family history of CAD.  Past Medical History:  Diagnosis Date  . Chronic constipation   . IBS (irritable bowel syndrome)   . Menorrhagia   . OA (osteoarthritis)     Patient Active Problem List   Diagnosis Date Noted  . Intractable vomiting 08/11/2018  . Transaminitis 08/10/2018  . COVID-19 08/09/2018  . Intractable nausea and vomiting 08/09/2018  . Upper airway cough syndrome 06/24/2018  . Menorrhagia 09/17/2015  . GERD (gastroesophageal reflux disease) 10/14/2013  . Other dysphagia 10/14/2013  . Hemorrhoids 09/28/2013  . Bloating 09/28/2013  . Unspecified constipation 09/28/2013    Past Surgical History:  Procedure Laterality Date  . BILATERAL SALPINGECTOMY Bilateral 09/17/2015   Procedure: BILATERAL SALPINGECTOMY;  Surgeon: Richarda Overlie, MD;  Location: [facility];  Service: Gynecology;  Laterality: Bilateral;  . DIAGNOSTIC LAPAROSCOPY  10-12-2000  . HYSTEROSCOPY WITH D & C  summer 2016  . LAPAROSCOPIC ASSISTED VAGINAL HYSTERECTOMY N/A 09/17/2015   Procedure: LAPAROSCOPIC ASSISTED VAGINAL HYSTERECTOMY;  Surgeon: Richarda Overlie, MD;  Location: [facility];  Service:  Gynecology;  Laterality: N/A;     OB History    Gravida  4   Para  2   Term      Preterm      AB      Living  2     SAB      IAB      Ectopic      Multiple      Live Births              Family History  Problem Relation Age of Onset  . Diabetes Maternal Aunt   . Cirrhosis Maternal Uncle   . Allergies Mother   . Heart failure Mother   . Emphysema Paternal Aunt        smoked  . Heart failure Father   . Colon cancer Neg Hx   . Pancreatic cancer Neg Hx     Social History   Tobacco Use  . Smoking status: Never Smoker  . Smokeless tobacco: Never Used  Vaping Use  . Vaping Use: Never used  Substance Use Topics  . Alcohol use: Yes    Comment: OCCASIONAL  . Drug use: No    Home Medications Prior to Admission medications   Medication Sig Start Date End Date Taking? Authorizing Provider  cholecalciferol (VITAMIN D3) 25 MCG (1000 UNIT) tablet Take 1,000 Units by mouth daily.    [provider]  clonazePAM (KLONOPIN) 0.5 MG tablet Take 0.5 mg by mouth 2 (two) times daily.  02/22/18   [provider]  docusate sodium (COLACE) 100 MG capsule 1 capsule as needed  [provider]  etanercept (ENBREL SURECLICK) 50 MG/ML injection 50 mg 10/11/19   [provider]  fluticasone (FLONASE) 50 MCG/ACT nasal spray Place 2 sprays into both nostrils daily.    [provider]  gabapentin (NEURONTIN) 100 MG capsule Take 100 mg by mouth at bedtime. 02/22/20   [provider]  Magnesium 500 MG CAPS Take by mouth.    [provider]  metoCLOPramide (REGLAN) 10 MG tablet Take 1 tablet (10 mg total) by mouth 1 day or 1 dose for 2 doses. 05/03/20 05/05/20  Unk Lightning, PA  Multiple Vitamin (MULTIVITAMIN WITH MINERALS) TABS tablet Take 1 tablet by mouth daily.    [provider]  omega-3 acid ethyl esters (LOVAZA) 1 g capsule Take 1 g by mouth daily.     [provider]  omeprazole (PRILOSEC) 40  MG capsule Take 1 capsule (40 mg total) by mouth every morning. Take 30-60 minutes before breakfast 04/13/18   Meredith Pel, NP  polyethylene glycol [facility] / GLYCOLAX) packet Take 17 g by mouth daily as needed for mild constipation.    [provider]  Probiotic Product (PROBIOTIC-10 PO) Take by mouth.    [provider]  TURMERIC PO Take by mouth.    [provider]    Allergies    Doxycycline and Morphine and related  Review of Systems   Review of Systems  Constitutional: Negative for chills and fever.  HENT: Negative for congestion.   Eyes: Negative for visual disturbance.  Respiratory: Negative for chest tightness and shortness of breath.   Cardiovascular: Negative for chest pain, palpitations and leg swelling.  Gastrointestinal: Negative for abdominal pain, diarrhea, nausea and vomiting.       + Epigastric/lower sternal pulling sensation  Genitourinary: Negative for dysuria.  Skin: Negative for rash.  Neurological: Negative for headaches.    Physical Exam Updated Vital Signs BP (!) 113/59 (BP Location: Right Arm)   Pulse 60   Temp 98.1 F (36.7 C) (Oral)   Resp 12   Ht 5\' 2"  (1.575 m)   Wt 71.7 kg   LMP 08/15/2015 (Approximate)   SpO2 99%   BMI 28.90 kg/m   Physical Exam Vitals and nursing note reviewed.  Constitutional:      General: She is not in acute distress.    Appearance: Normal appearance. She is well-developed. She is not ill-appearing or diaphoretic.  HENT:     Head: Normocephalic.     Mouth/Throat:     Mouth: Mucous membranes are moist.  Cardiovascular:     Rate and Rhythm: Normal rate.  Pulmonary:     Effort: Pulmonary effort is normal. No tachypnea or respiratory distress.  Chest:     Chest wall: No tenderness.  Abdominal:     Palpations: Abdomen is soft.     Tenderness: There is no abdominal tenderness. There is no guarding or rebound.  Musculoskeletal:     Right lower leg: No edema.     Left lower leg: No  edema.  Skin:    General: Skin is warm.  Neurological:     Mental Status: She is alert and oriented to person, place, and time. Mental status is at baseline.  Psychiatric:        Mood and Affect: Mood normal.     ED Results / Procedures / Treatments   Labs (all labs ordered are listed, but only abnormal results are displayed) Labs Reviewed  BASIC METABOLIC PANEL - Abnormal; Notable for the  following components:      Result Value   Glucose, Bld 115 (*)    All other components within normal limits  CBC  TROPONIN I (HIGH SENSITIVITY)  TROPONIN I (HIGH SENSITIVITY)    EKG EKG Interpretation  Date/Time:  Monday September 03 2020 07:42:07 EDT Ventricular Rate:  72 PR Interval:  153 QRS Duration: 95 QT Interval:  366 QTC Calculation: 401 R Axis:   75 Text Interpretation: Sinus rhythm 12 Lead; Mason-Likar NSR, similar toprevious Confirmed by Coralee Pesa [phone]) on 09/03/2020 9:43:48 AM   Radiology DG Chest 2 View  Result Date: 09/03/2020 CLINICAL DATA:  50 year old female with chest pain EXAM: CHEST - 2 VIEW COMPARISON:  08/09/2018 FINDINGS: Cardiomediastinal silhouette unchanged in size and contour. No evidence of central vascular congestion. No interlobular septal thickening. No pneumothorax or pleural effusion. No confluent airspace disease. No acute displaced fracture Degenerative changes of the spine IMPRESSION: Negative for acute cardiopulmonary disease Electronically Signed   By: Gilmer Mor D.O.   On: 09/03/2020 08:08    Procedures Procedures   Medications Ordered in ED Medications  alum & mag hydroxide-simeth (MAALOX/MYLANTA) 200-200-20 MG/5ML suspension 30 mL (30 mLs Oral Given 09/03/20 1024)    And  lidocaine (XYLOCAINE) 2 % viscous mouth solution 15 mL (15 mLs Oral Given 09/03/20 1024)    ED Course  I have reviewed the triage vital signs and the nursing notes.  Pertinent labs & imaging results that were available during my care of the patient were reviewed by me  and considered in my medical decision making (see chart for details).    MDM Rules/Calculators/A&P                          50 year old female presents emergency department with a pulling sensation in her lower chest/epigastric area, no radiation. Seems atypical for ACS. Vitals are stable on arrival, EKG is normal sinus rhythm and unchanged from previous.  Chest x-ray shows no acute finding.  Blood work is reassuring with her first troponin being negative.  Second troponin is negative with delta of 1.  After GI cocktail patient states her symptoms have significantly improved.  I suspect a GI aspect.  She is otherwise low risk for CAD, heart score of 1.  She is also PERC negative.  Her cardiac work-up is very reassuring today.  Discussed with the patient following up with her primary doctor for further cardiac evaluation and her GI doctor for further care.  Patient understands and states that her symptoms are significantly improved.  Patient will be discharged and treated as an outpatient.  Discharge plan and strict return to ED precautions discussed, patient verbalizes understanding and agreement.  Final Clinical Impression(s) / ED Diagnoses Final diagnoses:  None    Rx / DC Orders ED Discharge Orders    None       Rozelle Logan, DO 09/03/20 1229

## 2020-09-03 NOTE — ED Triage Notes (Signed)
Per pt, states she has been having central CP radiating to back for 3 days-no other symptoms-states she thought it was her reflex but pain did not resolve wihen she took her meds

## 2020-09-03 NOTE — Discharge Instructions (Addendum)
You have been seen and discharged from the emergency department.  Your cardiac work-up was normal.  Your symptoms may be coming from reflux/GI.  Follow-up with your primary provider and GI specialist for reevaluation and further care. Use Maalox as directed. Continue to take home medications as prescribed. If you have any worsening symptoms, severe chest pain, difficulty breathing or further concerns for your health please return to an emergency department for further evaluation.

## 2020-09-14 DIAGNOSIS — M459 Ankylosing spondylitis of unspecified sites in spine: Secondary | ICD-10-CM | POA: Diagnosis not present

## 2020-09-14 DIAGNOSIS — M1990 Unspecified osteoarthritis, unspecified site: Secondary | ICD-10-CM | POA: Diagnosis not present

## 2020-09-14 DIAGNOSIS — M549 Dorsalgia, unspecified: Secondary | ICD-10-CM | POA: Diagnosis not present

## 2020-09-14 DIAGNOSIS — M79673 Pain in unspecified foot: Secondary | ICD-10-CM | POA: Diagnosis not present

## 2020-11-30 DIAGNOSIS — M4690 Unspecified inflammatory spondylopathy, site unspecified: Secondary | ICD-10-CM | POA: Diagnosis not present

## 2020-11-30 DIAGNOSIS — M459 Ankylosing spondylitis of unspecified sites in spine: Secondary | ICD-10-CM | POA: Diagnosis not present

## 2020-12-17 DIAGNOSIS — M4690 Unspecified inflammatory spondylopathy, site unspecified: Secondary | ICD-10-CM | POA: Diagnosis not present

## 2020-12-20 DIAGNOSIS — Z01419 Encounter for gynecological examination (general) (routine) without abnormal findings: Secondary | ICD-10-CM | POA: Diagnosis not present

## 2020-12-20 DIAGNOSIS — Z1231 Encounter for screening mammogram for malignant neoplasm of breast: Secondary | ICD-10-CM | POA: Diagnosis not present

## 2020-12-20 DIAGNOSIS — Z6828 Body mass index (BMI) 28.0-28.9, adult: Secondary | ICD-10-CM | POA: Diagnosis not present

## 2020-12-20 DIAGNOSIS — N760 Acute vaginitis: Secondary | ICD-10-CM | POA: Diagnosis not present

## 2020-12-20 DIAGNOSIS — Z113 Encounter for screening for infections with a predominantly sexual mode of transmission: Secondary | ICD-10-CM | POA: Diagnosis not present

## 2020-12-26 DIAGNOSIS — N9089 Other specified noninflammatory disorders of vulva and perineum: Secondary | ICD-10-CM | POA: Diagnosis not present

## 2021-01-14 DIAGNOSIS — M79673 Pain in unspecified foot: Secondary | ICD-10-CM | POA: Diagnosis not present

## 2021-01-14 DIAGNOSIS — M549 Dorsalgia, unspecified: Secondary | ICD-10-CM | POA: Diagnosis not present

## 2021-01-14 DIAGNOSIS — M459 Ankylosing spondylitis of unspecified sites in spine: Secondary | ICD-10-CM | POA: Diagnosis not present

## 2021-01-14 DIAGNOSIS — M1990 Unspecified osteoarthritis, unspecified site: Secondary | ICD-10-CM | POA: Diagnosis not present

## 2021-01-14 DIAGNOSIS — Z23 Encounter for immunization: Secondary | ICD-10-CM | POA: Diagnosis not present

## 2021-01-25 DIAGNOSIS — K090 Developmental odontogenic cysts: Secondary | ICD-10-CM | POA: Diagnosis not present

## 2021-02-01 DIAGNOSIS — M459 Ankylosing spondylitis of unspecified sites in spine: Secondary | ICD-10-CM | POA: Diagnosis not present

## 2021-02-01 DIAGNOSIS — M4690 Unspecified inflammatory spondylopathy, site unspecified: Secondary | ICD-10-CM | POA: Diagnosis not present

## 2021-02-08 DIAGNOSIS — Z0000 Encounter for general adult medical examination without abnormal findings: Secondary | ICD-10-CM | POA: Diagnosis not present

## 2021-02-08 DIAGNOSIS — Z79899 Other long term (current) drug therapy: Secondary | ICD-10-CM | POA: Diagnosis not present

## 2021-03-11 DIAGNOSIS — M4690 Unspecified inflammatory spondylopathy, site unspecified: Secondary | ICD-10-CM | POA: Diagnosis not present

## 2021-03-20 DIAGNOSIS — M459 Ankylosing spondylitis of unspecified sites in spine: Secondary | ICD-10-CM | POA: Diagnosis not present

## 2021-03-20 DIAGNOSIS — R410 Disorientation, unspecified: Secondary | ICD-10-CM | POA: Diagnosis not present

## 2021-03-20 DIAGNOSIS — H8112 Benign paroxysmal vertigo, left ear: Secondary | ICD-10-CM | POA: Diagnosis not present

## 2021-03-20 DIAGNOSIS — F5101 Primary insomnia: Secondary | ICD-10-CM | POA: Diagnosis not present

## 2021-03-20 DIAGNOSIS — F411 Generalized anxiety disorder: Secondary | ICD-10-CM | POA: Diagnosis not present

## 2021-03-20 DIAGNOSIS — Z23 Encounter for immunization: Secondary | ICD-10-CM | POA: Diagnosis not present

## 2021-03-20 DIAGNOSIS — Z0000 Encounter for general adult medical examination without abnormal findings: Secondary | ICD-10-CM | POA: Diagnosis not present

## 2021-03-20 DIAGNOSIS — R413 Other amnesia: Secondary | ICD-10-CM | POA: Diagnosis not present

## 2021-03-25 DIAGNOSIS — Z20822 Contact with and (suspected) exposure to covid-19: Secondary | ICD-10-CM | POA: Diagnosis not present

## 2021-04-01 DIAGNOSIS — Z20822 Contact with and (suspected) exposure to covid-19: Secondary | ICD-10-CM | POA: Diagnosis not present

## 2021-07-14 IMAGING — CR DG CHEST 2V
2 series · 2 of 2 positions shown · non-contrast
Comparison: 08/09/2018

CLINICAL DATA: 49-year-old female with chest pain

EXAM:
CHEST - 2 VIEW

[w chest pa]
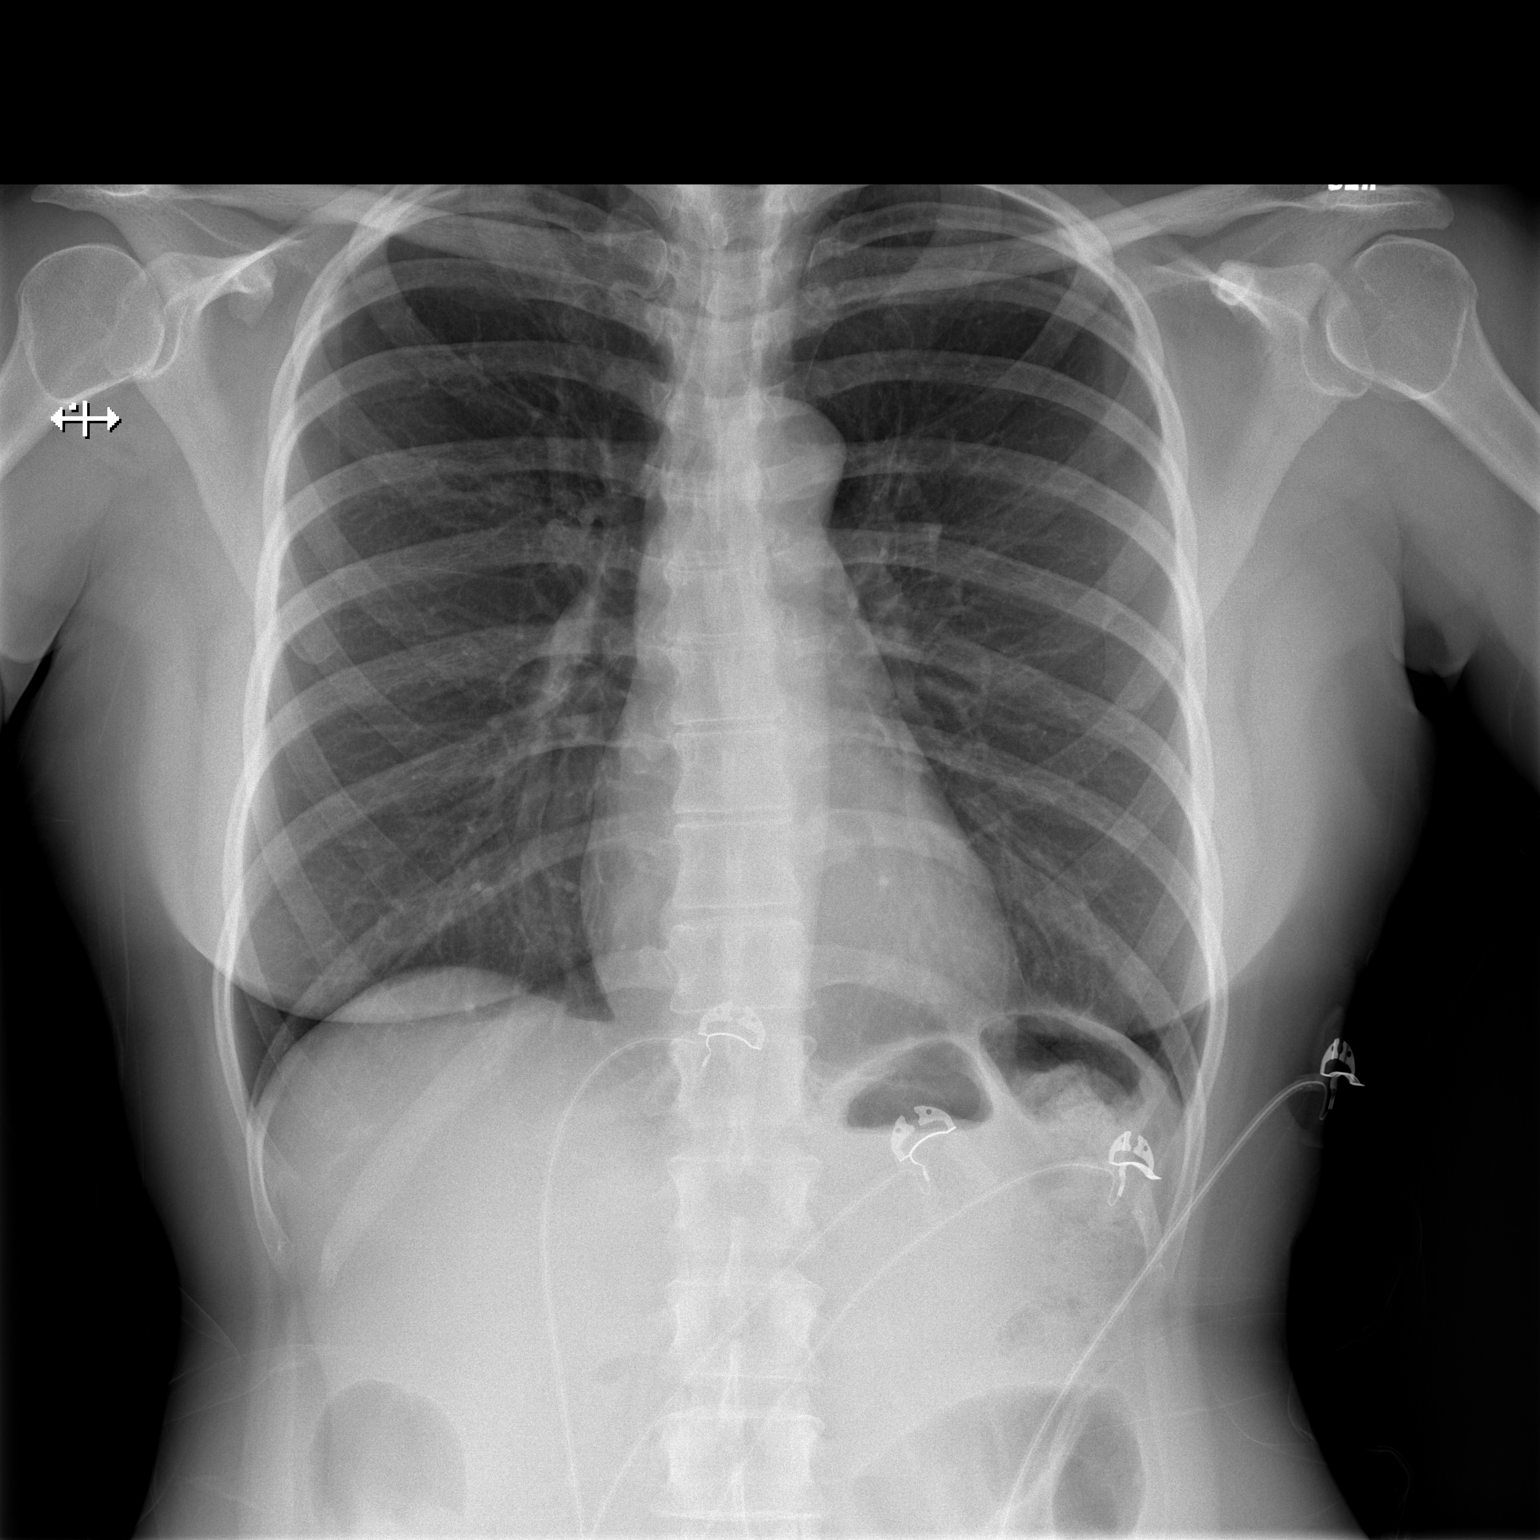

[w chest lat]
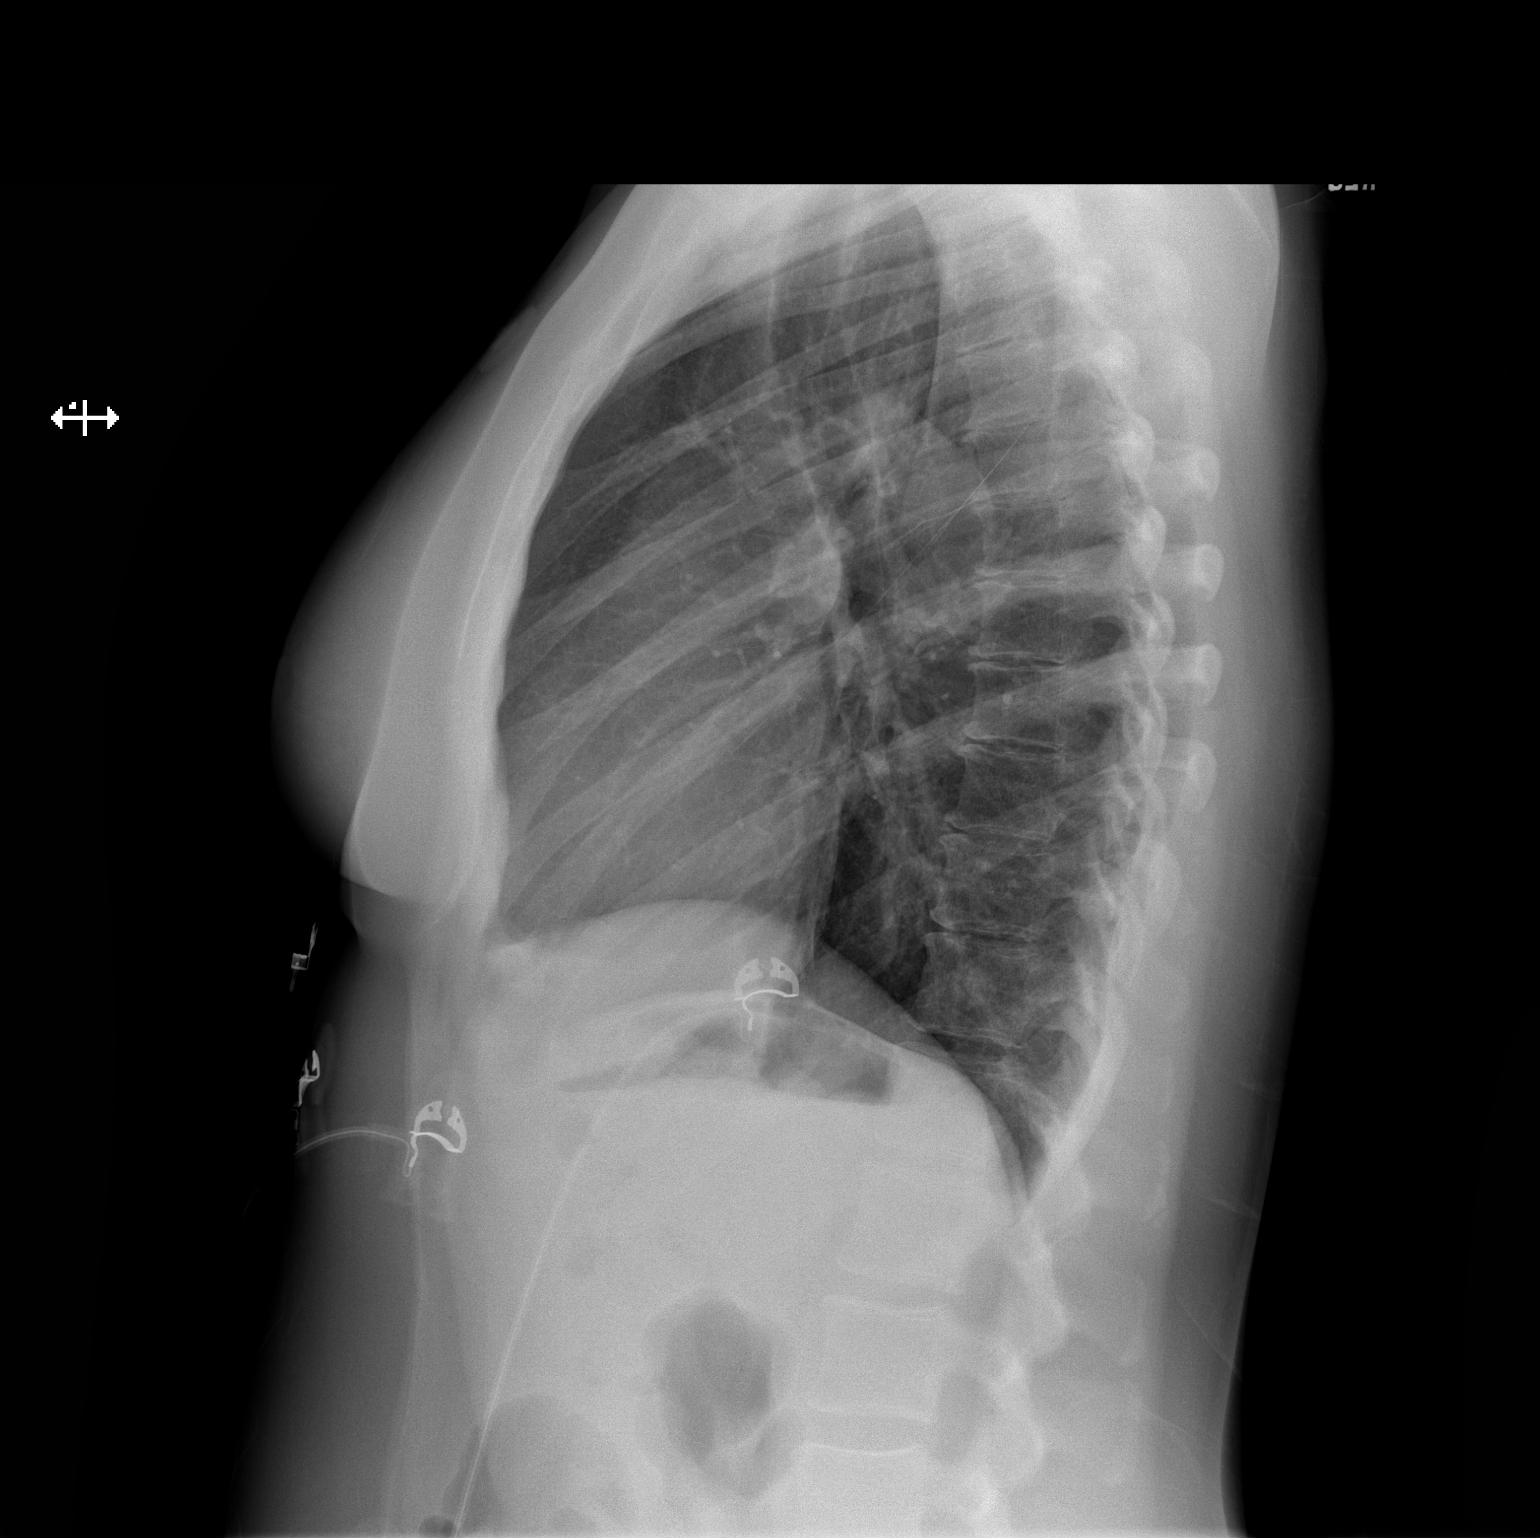

[2 of 2 positions shown; findings below may reference images not displayed]

FINDINGS: Cardiomediastinal silhouette unchanged in size and contour. No
evidence of central vascular congestion. No interlobular septal
thickening. No pneumothorax or pleural effusion. No confluent
airspace disease.

No acute displaced fracture

Degenerative changes of the spine
IMPRESSION: Negative for acute cardiopulmonary disease

## 2021-09-11 ENCOUNTER — Ambulatory Visit: Payer: BC Managed Care – PPO | Admitting: Gastroenterology

## 2022-01-08 ENCOUNTER — Other Ambulatory Visit: Payer: Self-pay | Admitting: Internal Medicine

## 2022-01-08 DIAGNOSIS — R109 Unspecified abdominal pain: Secondary | ICD-10-CM

## 2022-01-14 ENCOUNTER — Ambulatory Visit
Admission: RE | Admit: 2022-01-14 | Discharge: 2022-01-14 | Disposition: A | Discharge status: Home / Self Care | Payer: No Typology Code available for payment source | Source: Ambulatory Visit | Attending: Internal Medicine | Admitting: Internal Medicine

## 2022-01-14 DIAGNOSIS — R109 Unspecified abdominal pain: Secondary | ICD-10-CM

## 2022-04-14 ENCOUNTER — Ambulatory Visit: Payer: Self-pay | Admitting: Cardiology

## 2022-04-14 NOTE — Progress Notes (Signed)
Primary Physician/Referring:  Merrilee Seashore, MD  Patient ID: Yvette [name], female    DOB: 01/20/1971, 52 y.o.   MRN: [id number]  No chief complaint on file.  HPI:     Yvette [name]  is a 52 y.o. no significant cardiac history, no history of hypertension, has very mild hyperlipidemia and recently has been diagnosed as having ankylosing spondylitis, referred to me for evaluation of chest pain.  Chest pain started about a month ago, described as sharp pain in the left side of the chest.  Mostly in the left side but occasionally in other areas.  Mostly notices during while she is resting.  Laying on the left side worsens the pain.  No other associated symptoms.  Past Medical History:  Diagnosis Date   Chronic constipation    IBS (irritable bowel syndrome)    Menorrhagia    OA (osteoarthritis)    Past Surgical History:  Procedure Laterality Date   BILATERAL SALPINGECTOMY Bilateral 09/17/2015   Procedure: BILATERAL SALPINGECTOMY;  Surgeon: Molli Posey, MD;  Location: [facility];  Service: Gynecology;  Laterality: Bilateral;   DIAGNOSTIC LAPAROSCOPY  10-12-2000   HYSTEROSCOPY WITH D & C  summer 2016   LAPAROSCOPIC ASSISTED VAGINAL HYSTERECTOMY N/A 09/17/2015   Procedure: LAPAROSCOPIC ASSISTED VAGINAL HYSTERECTOMY;  Surgeon: Molli Posey, MD;  Location: [place];  Service: Gynecology;  Laterality: N/A;   Family History  Problem Relation Age of Onset   Diabetes Maternal Aunt    Cirrhosis Maternal Uncle    Allergies Mother    Heart failure Mother    Emphysema Paternal Aunt        smoked   Heart failure Father    Colon cancer Neg Hx    Pancreatic cancer Neg Hx     Social History   Tobacco Use   Smoking status: Never   Smokeless tobacco: Never  Substance Use Topics   Alcohol use: Yes    Comment: OCCASIONAL   Marital Status: Married  ROS  Review of Systems  Cardiovascular:  Positive for chest pain. Negative for dyspnea  on exertion and leg swelling.  Gastrointestinal:  Negative for melena.   Objective  Last menstrual period 08/15/2015.     09/03/2020   12:00 PM 09/03/2020   11:32 AM 09/03/2020   11:30 AM  Vitals with BMI  Systolic 607 371 062  Diastolic 67 64 64  Pulse 60 55 59     Physical Exam Cardiovascular:     Rate and Rhythm: Normal rate and regular rhythm.     Pulses: Intact distal pulses.     Heart sounds: Normal heart sounds. No murmur heard.    No gallop.     Comments: No leg edema, no JVD. Pulmonary:     Effort: Pulmonary effort is normal.     Breath sounds: Normal breath sounds.  Chest:     Chest wall: Tenderness (left costochondral junction) present.  Abdominal:     General: Bowel sounds are normal.     Palpations: Abdomen is soft.    Laboratory examination:   Lab Results  Component Value Date   NA 137 09/03/2020   K 4.3 09/03/2020   CO2 27 09/03/2020   GLUCOSE 115 (H) 09/03/2020   BUN 15 09/03/2020   CREATININE 0.86 09/03/2020   CALCIUM 9.7 09/03/2020   GFRNONAA >60 09/03/2020       Latest Ref Rng & Units 09/03/2020    8:45 AM 05/03/2020    9:06 AM 08/13/2018  5:57 AM  CMP  Glucose 70 - 99 mg/dL 115  87  94   BUN 6 - 20 mg/dL 15  10  5    Creatinine 0.44 - 1.00 mg/dL 0.86  0.74  0.68   Sodium 135 - 145 mmol/L 137  136  141   Potassium 3.5 - 5.1 mmol/L 4.3  4.0  3.6   Chloride 98 - 111 mmol/L 102  102  106   CO2 22 - 32 mmol/L 27  31  28    Calcium 8.9 - 10.3 mg/dL 9.7  9.7  8.5   Total Protein 6.0 - 8.3 g/dL  7.8  7.3   Total Bilirubin 0.2 - 1.2 mg/dL  0.4  0.6   Alkaline Phos 39 - 117 U/L  70  99   AST 0 - 37 U/L  15  110   ALT 0 - 35 U/L  6  109       Latest Ref Rng & Units 09/03/2020    8:45 AM 05/03/2020    9:06 AM 08/13/2018    5:57 AM  CBC  WBC 4.0 - 10.5 K/uL 6.8  5.7  5.1   Hemoglobin 12.0 - 15.0 g/dL 12.7  12.5  10.7   Hematocrit 36.0 - 46.0 % 38.9  36.6  32.7   Platelets 150 - 400 K/uL 317  283.0  461    Lab Results  Component Value Date   TSH  0.97 06/28/2010    External labs:   Cholesterol, total 211.000 m 02/08/2021 HDL 58.000 mg 02/08/2021 LDL 137.000 m 09/27/2019 Triglycerides 97.000 mg 02/08/2021  TSH 1.990 02/08/2021  Hemoglobin 12.700 g/d 09/03/2020 Platelets 317.000 K/ 09/03/2020  Labs 09/28/2019:  Hb 12.1/HCT 36.9, platelets 319.  Serum glucose 99 mg, BUN 13, creatinine 0.76, EGFR 93/107 mL.  Potassium 4.5.  CMP normal.  Total cholesterol 211, triglycerides 77, HDL 60, LDL 137.  TSH normal at 2.45.Vitamin D 38.2.  Medications and allergies   Allergies  Allergen Reactions   Doxycycline Rash   Morphine And Related Palpitations     Current Outpatient Medications:    aluminum-magnesium hydroxide 200-200 MG/5ML suspension, Take 10 mLs by mouth every 6 (six) hours as needed for indigestion., Disp: 355 mL, Rfl: 0   cholecalciferol (VITAMIN D3) 25 MCG (1000 UNIT) tablet, Take 1,000 Units by mouth daily., Disp: , Rfl:    clonazePAM (KLONOPIN) 0.5 MG tablet, Take 0.5 mg by mouth 2 (two) times daily. , Disp: , Rfl:    docusate sodium (COLACE) 100 MG capsule, 1 capsule as needed, Disp: , Rfl:    etanercept (ENBREL SURECLICK) 50 MG/ML injection, 50 mg, Disp: , Rfl:    fluticasone (FLONASE) 50 MCG/ACT nasal spray, Place 2 sprays into both nostrils daily., Disp: , Rfl:    gabapentin (NEURONTIN) 100 MG capsule, Take 100 mg by mouth at bedtime., Disp: , Rfl:    Magnesium 500 MG CAPS, Take by mouth., Disp: , Rfl:    metoCLOPramide (REGLAN) 10 MG tablet, Take 1 tablet (10 mg total) by mouth 1 day or 1 dose for 2 doses., Disp: 2 tablet, Rfl: 0   Multiple Vitamin (MULTIVITAMIN WITH MINERALS) TABS tablet, Take 1 tablet by mouth daily., Disp: , Rfl:    omega-3 acid ethyl esters (LOVAZA) 1 g capsule, Take 1 g by mouth daily. , Disp: , Rfl:    omeprazole (PRILOSEC) 40 MG capsule, Take 1 capsule (40 mg total) by mouth every morning. Take 30-60 minutes before breakfast, Disp: 30 capsule, Rfl: 5  polyethylene glycol (MIRALAX /  GLYCOLAX) packet, Take 17 g by mouth daily as needed for mild constipation., Disp: , Rfl:    Probiotic Product (PROBIOTIC-10 PO), Take by mouth., Disp: , Rfl:    TURMERIC PO, Take by mouth., Disp: , Rfl:   Current Facility-Administered Medications:    0.9 %  sodium chloride infusion, 500 mL, Intravenous, Once, Cirigliano, Vito V, DO  Radiology:   CT angiogram chest 03/14/2017: Cardiovascular: The heart size is normal. No obvious coronary artery calcifications identified. Evaluation of the thoracic aorta is limited due to cardiac motion but there is no evidence of aneurysm, dissection, or atherosclerosis. No evidence of pulmonary embolism.  Cardiac Studies:    EKG  EKG 10/17/2019: Normal sinus rhythm with rate of 69 bpm, normal axis.  No evidence of ischemia, normal EKG.  Assessment     ICD-10-CM   1. Palpitations  R00.2        There are no discontinued medications.   No orders of the defined types were placed in this encounter.   Recommendations:   SHARDAE KLEINMAN  is a 52 y.o. no significant cardiac history, no history of hypertension, has very mild hyperlipidemia and recently has been diagnosed as having ankylosing spondylitis, referred to me for evaluation of "Heart beat in the ear"    Reasons for reproducible pain explained to the patient. Heat to tender area. Cold ice compressions to reduce inflammation. Recommended Aleve OTC 1-2 tablets BID for 3-4 days and PRN. Reassured the patient.  Unless new symptoms, worsening symptoms, I will see her back on a as needed basis.  I reviewed her external labs, she does have very mild hyperlipidemia but does not need therapy for now, lifestyle modification discussed.  I have discontinued aspirin as there is no indication.Adrian Prows, MD, [facility] 04/14/2022, 6:43 AM Office: [phone]

## 2022-06-07 ENCOUNTER — Encounter: Payer: Self-pay | Admitting: Cardiology

## 2022-09-01 ENCOUNTER — Other Ambulatory Visit: Payer: Self-pay | Admitting: Internal Medicine

## 2022-09-01 DIAGNOSIS — D1721 Benign lipomatous neoplasm of skin and subcutaneous tissue of right arm: Secondary | ICD-10-CM

## 2022-09-23 ENCOUNTER — Ambulatory Visit
Admission: RE | Admit: 2022-09-23 | Discharge: 2022-09-23 | Disposition: A | Discharge status: Home / Self Care | Payer: No Typology Code available for payment source | Source: Ambulatory Visit | Attending: Internal Medicine | Admitting: Internal Medicine

## 2022-09-23 DIAGNOSIS — D1721 Benign lipomatous neoplasm of skin and subcutaneous tissue of right arm: Secondary | ICD-10-CM

## 2023-07-20 DIAGNOSIS — Z01419 Encounter for gynecological examination (general) (routine) without abnormal findings: Secondary | ICD-10-CM | POA: Diagnosis not present

## 2023-07-20 DIAGNOSIS — Z6829 Body mass index (BMI) 29.0-29.9, adult: Secondary | ICD-10-CM | POA: Diagnosis not present

## 2023-07-20 DIAGNOSIS — Z1231 Encounter for screening mammogram for malignant neoplasm of breast: Secondary | ICD-10-CM | POA: Diagnosis not present

## 2023-07-22 DIAGNOSIS — Z79899 Other long term (current) drug therapy: Secondary | ICD-10-CM | POA: Diagnosis not present

## 2023-07-22 DIAGNOSIS — M1990 Unspecified osteoarthritis, unspecified site: Secondary | ICD-10-CM | POA: Diagnosis not present

## 2023-07-22 DIAGNOSIS — F5101 Primary insomnia: Secondary | ICD-10-CM | POA: Diagnosis not present

## 2023-07-22 DIAGNOSIS — M549 Dorsalgia, unspecified: Secondary | ICD-10-CM | POA: Diagnosis not present

## 2023-07-22 DIAGNOSIS — M459 Ankylosing spondylitis of unspecified sites in spine: Secondary | ICD-10-CM | POA: Diagnosis not present

## 2023-07-22 DIAGNOSIS — Z5181 Encounter for therapeutic drug level monitoring: Secondary | ICD-10-CM | POA: Diagnosis not present

## 2023-07-22 DIAGNOSIS — Z0000 Encounter for general adult medical examination without abnormal findings: Secondary | ICD-10-CM | POA: Diagnosis not present

## 2023-07-29 DIAGNOSIS — M459 Ankylosing spondylitis of unspecified sites in spine: Secondary | ICD-10-CM | POA: Diagnosis not present

## 2023-07-29 DIAGNOSIS — F5101 Primary insomnia: Secondary | ICD-10-CM | POA: Diagnosis not present

## 2023-07-29 DIAGNOSIS — Z0000 Encounter for general adult medical examination without abnormal findings: Secondary | ICD-10-CM | POA: Diagnosis not present

## 2023-07-29 DIAGNOSIS — F411 Generalized anxiety disorder: Secondary | ICD-10-CM | POA: Diagnosis not present

## 2023-07-29 DIAGNOSIS — M4690 Unspecified inflammatory spondylopathy, site unspecified: Secondary | ICD-10-CM | POA: Diagnosis not present

## 2023-09-10 ENCOUNTER — Ambulatory Visit: Payer: Self-pay | Admitting: Gastroenterology

## 2023-09-26 DIAGNOSIS — J019 Acute sinusitis, unspecified: Secondary | ICD-10-CM | POA: Diagnosis not present

## 2023-09-26 DIAGNOSIS — R051 Acute cough: Secondary | ICD-10-CM | POA: Diagnosis not present

## 2023-09-26 DIAGNOSIS — J069 Acute upper respiratory infection, unspecified: Secondary | ICD-10-CM | POA: Diagnosis not present
